# Patient Record
Sex: Male | Born: 1965 | Race: White | Hispanic: No | Marital: Single | Smoking: Former smoker
Health system: Southern US, Community
[De-identification: ages and names within clinical notes are randomized; demographics above are authoritative.]

## PROBLEM LIST (undated history)

## (undated) DIAGNOSIS — N182 Chronic kidney disease, stage 2 (mild): Secondary | ICD-10-CM

## (undated) DIAGNOSIS — E785 Hyperlipidemia, unspecified: Secondary | ICD-10-CM

## (undated) DIAGNOSIS — E119 Type 2 diabetes mellitus without complications: Secondary | ICD-10-CM

## (undated) DIAGNOSIS — I2510 Atherosclerotic heart disease of native coronary artery without angina pectoris: Secondary | ICD-10-CM

## (undated) DIAGNOSIS — I219 Acute myocardial infarction, unspecified: Secondary | ICD-10-CM

## (undated) DIAGNOSIS — L409 Psoriasis, unspecified: Secondary | ICD-10-CM

## (undated) DIAGNOSIS — F32A Depression, unspecified: Secondary | ICD-10-CM

## (undated) HISTORY — DX: Atherosclerotic heart disease of native coronary artery without angina pectoris: I25.10

## (undated) HISTORY — DX: Hyperlipidemia, unspecified: E78.5

## (undated) HISTORY — DX: Chronic kidney disease, stage 2 (mild): N18.2

## (undated) HISTORY — DX: Acute myocardial infarction, unspecified: I21.9

## (undated) HISTORY — DX: Psoriasis, unspecified: L40.9

## (undated) HISTORY — DX: Type 2 diabetes mellitus without complications: E11.9

## (undated) HISTORY — DX: Depression, unspecified: F32.A

---

## 2000-05-14 ENCOUNTER — Encounter: Payer: Self-pay | Admitting: Internal Medicine

## 2000-05-14 ENCOUNTER — Ambulatory Visit (HOSPITAL_COMMUNITY): Admission: RE | Admit: 2000-05-14 | Discharge: 2000-05-14 | Payer: Self-pay | Admitting: Internal Medicine

## 2003-11-22 ENCOUNTER — Emergency Department (HOSPITAL_COMMUNITY): Admission: EM | Admit: 2003-11-22 | Discharge: 2003-11-22 | Payer: Self-pay | Admitting: Emergency Medicine

## 2004-05-15 ENCOUNTER — Ambulatory Visit: Payer: Self-pay | Admitting: Internal Medicine

## 2006-01-20 ENCOUNTER — Ambulatory Visit: Payer: Self-pay | Admitting: Internal Medicine

## 2010-06-30 ENCOUNTER — Inpatient Hospital Stay (HOSPITAL_COMMUNITY): Payer: BC Managed Care – PPO

## 2010-06-30 ENCOUNTER — Inpatient Hospital Stay (HOSPITAL_COMMUNITY)
Admission: EM | Admit: 2010-06-30 | Discharge: 2010-07-03 | DRG: 853 | Disposition: A | Discharge status: Home / Self Care | Payer: BC Managed Care – PPO | Attending: Cardiovascular Disease | Admitting: Cardiovascular Disease

## 2010-06-30 ENCOUNTER — Emergency Department (HOSPITAL_COMMUNITY): Payer: BC Managed Care – PPO

## 2010-06-30 DIAGNOSIS — I2582 Chronic total occlusion of coronary artery: Secondary | ICD-10-CM | POA: Diagnosis present

## 2010-06-30 DIAGNOSIS — K219 Gastro-esophageal reflux disease without esophagitis: Secondary | ICD-10-CM | POA: Diagnosis present

## 2010-06-30 DIAGNOSIS — I959 Hypotension, unspecified: Secondary | ICD-10-CM | POA: Diagnosis present

## 2010-06-30 DIAGNOSIS — I2109 ST elevation (STEMI) myocardial infarction involving other coronary artery of anterior wall: Principal | ICD-10-CM | POA: Diagnosis present

## 2010-06-30 DIAGNOSIS — I2510 Atherosclerotic heart disease of native coronary artery without angina pectoris: Secondary | ICD-10-CM | POA: Diagnosis present

## 2010-06-30 DIAGNOSIS — Z87891 Personal history of nicotine dependence: Secondary | ICD-10-CM

## 2010-06-30 DIAGNOSIS — I10 Essential (primary) hypertension: Secondary | ICD-10-CM | POA: Diagnosis present

## 2010-06-30 DIAGNOSIS — I219 Acute myocardial infarction, unspecified: Secondary | ICD-10-CM

## 2010-06-30 DIAGNOSIS — E119 Type 2 diabetes mellitus without complications: Secondary | ICD-10-CM | POA: Diagnosis present

## 2010-06-30 HISTORY — PX: CORONARY ANGIOPLASTY WITH STENT PLACEMENT: SHX49

## 2010-06-30 HISTORY — PX: CARDIAC CATHETERIZATION: SHX172

## 2010-06-30 HISTORY — DX: Acute myocardial infarction, unspecified: I21.9

## 2010-06-30 LAB — COMPREHENSIVE METABOLIC PANEL
ALT: 39 U/L (ref 0–53)
AST: 26 U/L (ref 0–37)
Albumin: 3.8 g/dL (ref 3.5–5.2)
Alkaline Phosphatase: 98 U/L (ref 39–117)
BUN: 11 mg/dL (ref 6–23)
CO2: 27 mEq/L (ref 19–32)
Calcium: 9.1 mg/dL (ref 8.4–10.5)
Chloride: 100 mEq/L (ref 96–112)
Creatinine, Ser: 1.42 mg/dL (ref 0.4–1.5)
GFR calc Af Amer: >60 mL/min (ref >60)
GFR calc non Af Amer: 54 mL/min — ABNORMAL LOW (ref >60)
Glucose, Bld: 298 mg/dL — ABNORMAL HIGH (ref 70–99)
Potassium: 3.6 mEq/L (ref 3.5–5.1)
Sodium: 135 mEq/L (ref 135–145)
Total Bilirubin: 0.4 mg/dL (ref 0.3–1.2)
Total Protein: 6.8 g/dL (ref 6.0–8.3)

## 2010-06-30 LAB — CBC
HCT: 40.9 % (ref 39.0–52.0)
Hemoglobin: 14.7 g/dL (ref 13.0–17.0)
MCH: 32.2 pg (ref 26.0–34.0)
MCHC: 35.9 g/dL (ref 30.0–36.0)

## 2010-06-30 LAB — HEPATIC FUNCTION PANEL
ALT: 44 U/L (ref 0–53)
Alkaline Phosphatase: 83 U/L (ref 39–117)
Bilirubin, Direct: <0.1 mg/dL (ref 0.0–0.3)

## 2010-06-30 LAB — POCT CARDIAC MARKERS
CKMB, poc: <1 ng/mL — ABNORMAL LOW (ref 1.0–8.0)
Myoglobin, poc: 54.1 ng/mL (ref 12–200)
Troponin i, poc: <0.05 ng/mL (ref 0.00–0.09)

## 2010-06-30 LAB — POCT I-STAT, CHEM 8
BUN: 14 mg/dL (ref 6–23)
Calcium, Ion: 1.14 mmol/L (ref 1.12–1.32)
Chloride: 100 mEq/L (ref 96–112)
Creatinine, Ser: 1.4 mg/dL (ref 0.4–1.5)
Glucose, Bld: 306 mg/dL — ABNORMAL HIGH (ref 70–99)
HCT: 44 % (ref 39.0–52.0)
Hemoglobin: 15 g/dL (ref 13.0–17.0)
Potassium: 3.7 mEq/L (ref 3.5–5.1)
Sodium: 138 mEq/L (ref 135–145)
TCO2: 26 mmol/L (ref 0–100)

## 2010-06-30 LAB — CARDIAC PANEL(CRET KIN+CKTOT+MB+TROPI): Relative Index: 12.7 — ABNORMAL HIGH (ref 0.0–2.5)

## 2010-06-30 LAB — GLUCOSE, CAPILLARY

## 2010-07-01 LAB — CARDIAC PANEL(CRET KIN+CKTOT+MB+TROPI): Relative Index: 11.5 — ABNORMAL HIGH (ref 0.0–2.5)

## 2010-07-01 LAB — HEMOGLOBIN A1C
Hgb A1c MFr Bld: 8 % — ABNORMAL HIGH (ref <5.7)
Mean Plasma Glucose: 183 mg/dL — ABNORMAL HIGH (ref <117)

## 2010-07-01 LAB — BASIC METABOLIC PANEL
CO2: 29 mEq/L (ref 19–32)
Calcium: 9 mg/dL (ref 8.4–10.5)
GFR calc Af Amer: >60 mL/min (ref >60)
GFR calc non Af Amer: >60 mL/min (ref >60)
Sodium: 142 mEq/L (ref 135–145)

## 2010-07-01 LAB — GLUCOSE, CAPILLARY
Glucose-Capillary: 210 mg/dL — ABNORMAL HIGH (ref 70–99)
Glucose-Capillary: 142 mg/dL — ABNORMAL HIGH (ref 70–99)
Glucose-Capillary: 203 mg/dL — ABNORMAL HIGH (ref 70–99)

## 2010-07-01 LAB — MAGNESIUM: Magnesium: 1.9 mg/dL (ref 1.5–2.5)

## 2010-07-01 LAB — CBC
HCT: 37 % — ABNORMAL LOW (ref 39.0–52.0)
RDW: 12.7 % (ref 11.5–15.5)
WBC: 12.3 10*3/uL — ABNORMAL HIGH (ref 4.0–10.5)

## 2010-07-01 LAB — LIPID PANEL
Cholesterol: 192 mg/dL (ref 0–200)
LDL Cholesterol: 123 mg/dL — ABNORMAL HIGH (ref 0–99)
Triglycerides: 194 mg/dL — ABNORMAL HIGH (ref <150)
VLDL: 39 mg/dL (ref 0–40)

## 2010-07-01 LAB — HEPARIN LEVEL (UNFRACTIONATED)
Heparin Unfractionated: 0.11 IU/mL — ABNORMAL LOW (ref 0.30–0.70)
Heparin Unfractionated: 0.59 IU/mL (ref 0.30–0.70)

## 2010-07-01 LAB — POCT ACTIVATED CLOTTING TIME: Activated Clotting Time: 493 seconds

## 2010-07-01 NOTE — Procedures (Signed)
NAMEVASHON, Wilcox NO.:  [id number]  MEDICAL RECORD NO.:  [id number]           PATIENT TYPE:  E  LOCATION:  [facility]                         FACILITY:  [facility]  PHYSICIAN:  Nicki Guadalajara, M.D.     DATE OF BIRTH:  1965-10-02  DATE OF PROCEDURE:  06/30/2010 DATE OF DISCHARGE:                           CARDIAC CATHETERIZATION   PROCEDURE:  Emergent cardiac catheterization.  INDICATIONS:  [name] is a 45 year old gentleman who has approximately greater than 20-year history of tobacco use and approximately 7-year history of hypertension as well as GERD.  He had recently quit tobacco use approximately 2 months ago.  Today, at approximately 10:00 a.m., the patient developed new onset chest pressure.  He ultimately presented to [facility] emergency room where his ECG at 11:23 suggested early J-point elevation in leads II, III, and F.  At the time, I was in an emergent cardiac catheterization with another patient having an MI and was just completing that acute intervention.  This patient was treated with heparin in the emergency room.  As soon as I completed the previous patient, this patient was then brought up to a different room and immediately scrubbed and performed emergent catheterization for possible acute coronary syndrome/early inferior myocardial infarction.  PROCEDURE IN DETAIL:  Upon arrival to the catheterization laboratory, the patient still was having some residual chest discomfort, approximately 6/10.  He had received nitroglycerin in the emergency room which did drop his blood pressure to approximately 80.  At this time, his fluids were wide open.  Right femoral artery was punctured anteriorly and a 6-French sheath was inserted.  Diagnostic catheterization was done utilizing 6-French Judkins for the left and right coronary catheters.  The patient did have several areas of abnormality in the LAD system which led to making certain this was  not causing some of the distal disease.  With the demonstration of total occlusion of his right coronary artery, his diagnostic catheter was immediately exchanged for a 6-French right guide.  The patient received Angiomax.  He also received 60 mg of Effient antiplatelet therapy.  His blood pressure still remained in the 80 systolic range despite wide open normal saline.  A Prowater wire was advanced down the right coronary artery and was able to cross the total occlusion distally at the bifurcation of the continuation in the branched PDA and PDA.  A 2.5 x 12 mm TREK balloon was then inserted and multiple dilatations were made up to 10 atmospheres.  A 2.75 x 24 mm Promus Element DES stent was then inserted in the distal RCA to cover some additional lesion in the proximal PDA segment.  This was dilated x2 up to 13 atmospheres.  A 3.0 x 20 mm noncompliant TREK was used for poststent dilatation with stent taper from approximately 3.0 proximally to 2.9 distally.  Scout angiography confirmed an excellent angiographic result.  There was brisk TIMI 3 flow.  With reperfusion, the patient's blood pressure did decrease slightly.  Now additionally and consequently dopamine had been added and this was titrated up to 6 mcg/kg.  The right catheter and wires were then removed and a  6-French pigtail catheter was then inserted into the LV.  Biplane cine left ventriculography was performed. With the patient's hypertensive history and his multivessel CAD, distal aortography was also performed.  He left the catheterization laboratory with stable hemodynamics with continued fluid and on dopamine and will be transported to the Coronary Care Unit.  HEMODYNAMIC DATA:  Central aortic pressure was 85/64.  Left ventricular pressure 85/15.  ANGIOGRAPHIC DATA:  Left main coronary artery was angiographically normal and bifurcated into the LAD and left circumflex system.  There was proximal narrowing of 60% in the  LAD before the first diagonal vessel.  There was mid-LAD stenoses of approximately 60%.  The very distal and apical portion of the LAD was small caliber and had segmental narrowing of 80% in the small caliber vessel reaching the apex.  The circumflex vessel gave rise to one major marginal vessel.  There was 50% narrowing in the AV groove circumflex after the marginal takeoff. There was 50% narrowing in the distal aspect of this marginal branch.  The right coronary artery was totally occluded just beyond the acute margin at the region of the bifurcation of the PDA and continuation branch of the right coronary artery with initial TIMI-0 flow.  A subsequent injection did reveal a trickle of flow beyond this.  Following successful percutaneous coronary intervention to the distal right coronary artery with PTCA, ultimate stenting with a 2.75 x 24-mm DES Promus Element stent postdilated approximately at 3.0.  The 100% occlusion was reduced to 0%.  There was brisk TIMI-3 flow.  There was no change in the previous 60% mid RCA narrowing.  The biplane cine left ventriculography revealed preserved global contractility.  Ejection fraction was at least 55-60%.  On the RAO projection, there was a small subtle region of very mild focal hypocontractility.  On the LAO projection, contractility appeared normal.  Distal aortography revealed widely patent renal arteries without significant stenosis.  IMPRESSION: 1. Acute coronary syndrome/early ST-segment elevation myocardial     infarction secondary to right coronary artery occlusion. 2. Multivessel coronary artery disease with 60% proximal left anterior     descending stenosis, 60% mid stenosis, diffuse distal and apical     left anterior descending stenoses of 80%; 50% atrioventricular     groove circumflex stenosis with 50% distal circumflex marginal     stenosis and total occlusion of proximal right coronary artery. 3. Successful percutaneous  coronary intervention of the right coronary     artery with 100% occlusion being reduced to 0% and with insertion     of a 2.75 x 24 mm Promus Element drug eluting stent postdilated     3.0. 4. Bivalidurin/Effient 60 mg for anticoagulation/antiplatelet therapy. 5. The patient was hypotensive requiring aggressive fluids as well as     initiation of dopamine. 6. Once the patient arrived in the cardiac catheterization laboratory,     there was a 27-minute time to balloon dilatation from cath lab     arrival.          ______________________________ Nicki Guadalajara, M.D.     TK/MEDQ  D:  06/30/2010  T:  07/01/2010  Job:  [id number]  Electronically Signed by Nicki Guadalajara M.D. on 07/01/2010 04:01:41 PM

## 2010-07-02 LAB — GLUCOSE, CAPILLARY
Glucose-Capillary: 139 mg/dL — ABNORMAL HIGH (ref 70–99)
Glucose-Capillary: 170 mg/dL — ABNORMAL HIGH (ref 70–99)
Glucose-Capillary: 155 mg/dL — ABNORMAL HIGH (ref 70–99)

## 2010-07-02 LAB — CBC
MCH: 31.3 pg (ref 26.0–34.0)
MCHC: 34.4 g/dL (ref 30.0–36.0)
RDW: 12.7 % (ref 11.5–15.5)

## 2010-07-02 LAB — BASIC METABOLIC PANEL
Calcium: 8.9 mg/dL (ref 8.4–10.5)
Creatinine, Ser: 1.24 mg/dL (ref 0.4–1.5)
GFR calc Af Amer: >60 mL/min (ref >60)
GFR calc non Af Amer: >60 mL/min (ref >60)
Sodium: 141 mEq/L (ref 135–145)

## 2010-07-03 LAB — GLUCOSE, CAPILLARY: Glucose-Capillary: 149 mg/dL — ABNORMAL HIGH (ref 70–99)

## 2010-07-05 NOTE — Discharge Summary (Signed)
NAMEBENNETT, Dennis Wilcox             ACCOUNT NO.:  [id number]  MEDICAL RECORD NO.:  [id number]           PATIENT TYPE:  I  LOCATION:  2024                         FACILITY:  [facility]  PHYSICIAN:  [place] Finbar Nippert, MD         DATE OF BIRTH:  November 08, 1965  DATE OF ADMISSION:  06/30/2010 DATE OF DISCHARGE:  07/03/2010                              DISCHARGE SUMMARY   DISCHARGE DIAGNOSES: 1. ST-elevation myocardial infarction anterior wall status post stent     to the distal right coronary artery.  The left anterior descending     with 60% proximal stenosis, 60% mid stenosis, 80% diffuse distal     stenosis.  Circumflex showed distal marginal and atrioventricular     groove with 50% stenosis.  Ejection fraction of 55% to 60%. 2. Hypotension. 3. Gastroesophageal reflux. 4. Tobacco use, the patient states he quit 2 months ago. 5. Day venous mellitus type 2, poorly diagnosed, hemoglobin A1c is     8.0.  HOSPITAL COURSE:  Dennis Wilcox is a 45 year old Caucasian male with a history of hypertension and gastroesophageal reflux disease.  He developed upper anterior chest pain with radiation to his neck and jaw. He initially thought it might be indigestion.  He became hot, diaphoretic, had short of breath, nausea, and diarrhea.  In the ED here at [facility], he was given aspirin and nitroglycerin with some relief. He also had decreased blood pressure and was given IV fluids.  EKG showed J-point elevation in lead III.  The patient was subsequently taken to the cath lab urgently.  His initial point-of-care markers were negative; however, subsequent troponin was 27.34 which was the peak and CK-MB of 141.2 which was the peak of that one as well.  His BNP was less than 30.  In the cath lab, he was found to have multivessel coronary artery disease with a 60% proximal left anterior descending stenosis, 60% mid stenosis, diffuse distal and apical left anterior descending stenoses 80%, also a 50% atrioventricular  groove circumflex stenosis, 50% distal circumflex marginal stenosis, and total occlusion of the proximal right coronary artery.  Right coronary artery was stented with a Promus drug-eluting stent.  The patient was hypotensive, required aggressive fluid as well as initiation of dopamine.  It was 27 minutes timed balloon dilation from the cath lab.  As of July 01, 2010, the patient was feeling better.  O2 sats dropped to 87% on room air.  The patient's blood pressure improved and was off the dopamine.  His Lopressor was increased to 12.5 q.8 h., also started Niaspan.  The patient was started on cardiac rehab, was continued on heparin.  The patient is also newly diagnosed with type 2 diabetes.  His hemoglobin A1c was 8.0.  Nutrition consult was requested.  The patient will go home on metformin 500 mg b.i.d. and has been recommended to follow up with his primary care doctor.  At this time, he is chest pain free, doing well, seen by Dr. Rennis Golden who feels he is stable for discharged home.  DISCHARGE LABORATORY DATA:  WBCs 8.9, hemoglobin 12.8, hematocrit 37.2, platelets 330,000.  Sodium  141, potassium 4.1, chloride 108, carbon dioxide 28, glucose 154, BUN 10, creatinine 1.24, calcium 8.9. Hemoglobin A1c 8.0, mean plasma glucose 183.  Total cholesterol 192, triglycerides 194, HDL 30, LDL 123, VLDL 39, and total cholesterol to HDL ratio of 6.4.  Thyroid stimulating hormone was 1.233, and he was MRSA negative.  STUDIES/PROCEDURES: 1. Chest x-ray on June 30, 2010, showed no acute cardiopulmonary     process.  Lungs were clear without focal infiltrate, edema,     pneumothorax, or effusion.  Pericardial silhouette.  Upper limits     of normal for size. 2. Cardiac catheterization on June 30, 2010, impression acute     coronary syndrome, early ST-segment elevation myocardial infarction     secondary to right coronary occlusion.  Multivessel coronary artery     disease of 60%, proximal left  anterior descending stenosis, 60% mid     stenosis, diffuse distal typical left anterior descending stenoses     of 80%.  A 50% atrioventricular groove circumflex stenosis and 50%     distal circumflex marginal stenosis, total occlusion of the     proximal right coronary artery.  A Promus drug-eluting stent     successfully placed in the right coronary artery with a 100%     occlusion being reduced to 0%.  Ejection fraction was estimated at     55% to 60%.  DISCHARGE MEDICATIONS: 1. Acetaminophen 325 mg 2 tablets by mouth every fours as needed for     pain. 2. Aspirin 325 mg enteric-coated 1 tablet by mouth daily. 3. Imdur 30 mg one-half tablet by mouth daily. 4. Lisinopril 2.5 mg one tablet by mouth daily. 5. Metoprolol tartrate 25 mg one-half tablet by mouth three times a     day. 6. Niacin 500 mg tablets extended release 1 tablet by mouth daily at     bedtime. 7. Nitroglycerin sublingual 0.4 mg tablets 1 tablet under the tongue     every 5 minutes up to three doses for chest pain. 8. Effient 10 mg 1 tablet by mouth daily. 9. Rosuvastatin 10 mg 1 tablet by mouth daily. 10.Omeprazole 20 mg 1 capsule by mouth at noon. 11.Venlafaxine XR 75 mg 2 capsules by mouth every morning. 12.Metformin 500 mg 1 tablet by mouth twice daily.  DISPOSITION:  Dennis Wilcox will be discharged home in stable condition. He is to return to work no earlier than July 22, 2010.  He is to increase his activity slowly.  May shower and bathe.  No lifting or driving for 2 days.  He was recommended he eat heart-healthy, carb- modified diet.  If the catheter site becomes red, painful, swollen, or discharges fluid or pus, he is to call our office immediately.  He will return to Dr. Landry Dyke office on Wednesday, July 10, 2010, at 8:30 a.m. for followup appointment.    ______________________________ Wilburt Finlay, PA   ______________________________ [place] Ia Leeb, MD    BH/MEDQ  D:  07/03/2010  T:  07/04/2010   Job:  [id number]  cc:   Edythe Lynn, NP  Electronically Signed by Wilburt Finlay PA on 07/04/2010 03:17:19 PM Electronically Signed by Kirtland Bouchard. Acquanetta Cabanilla M.D. on 07/05/2010 04:44:59 PM

## 2010-08-06 DIAGNOSIS — E785 Hyperlipidemia, unspecified: Secondary | ICD-10-CM

## 2010-08-06 HISTORY — DX: Hyperlipidemia, unspecified: E78.5

## 2011-08-21 HISTORY — PX: OTHER SURGICAL HISTORY: SHX169

## 2011-12-13 IMAGING — CR DG CHEST 1V PORT
1 series · 1 of 1 positions shown · non-contrast
Comparison: None.

CLINICAL DATA: Chest pain

PORTABLE CHEST - 1 VIEW

[view not recorded]
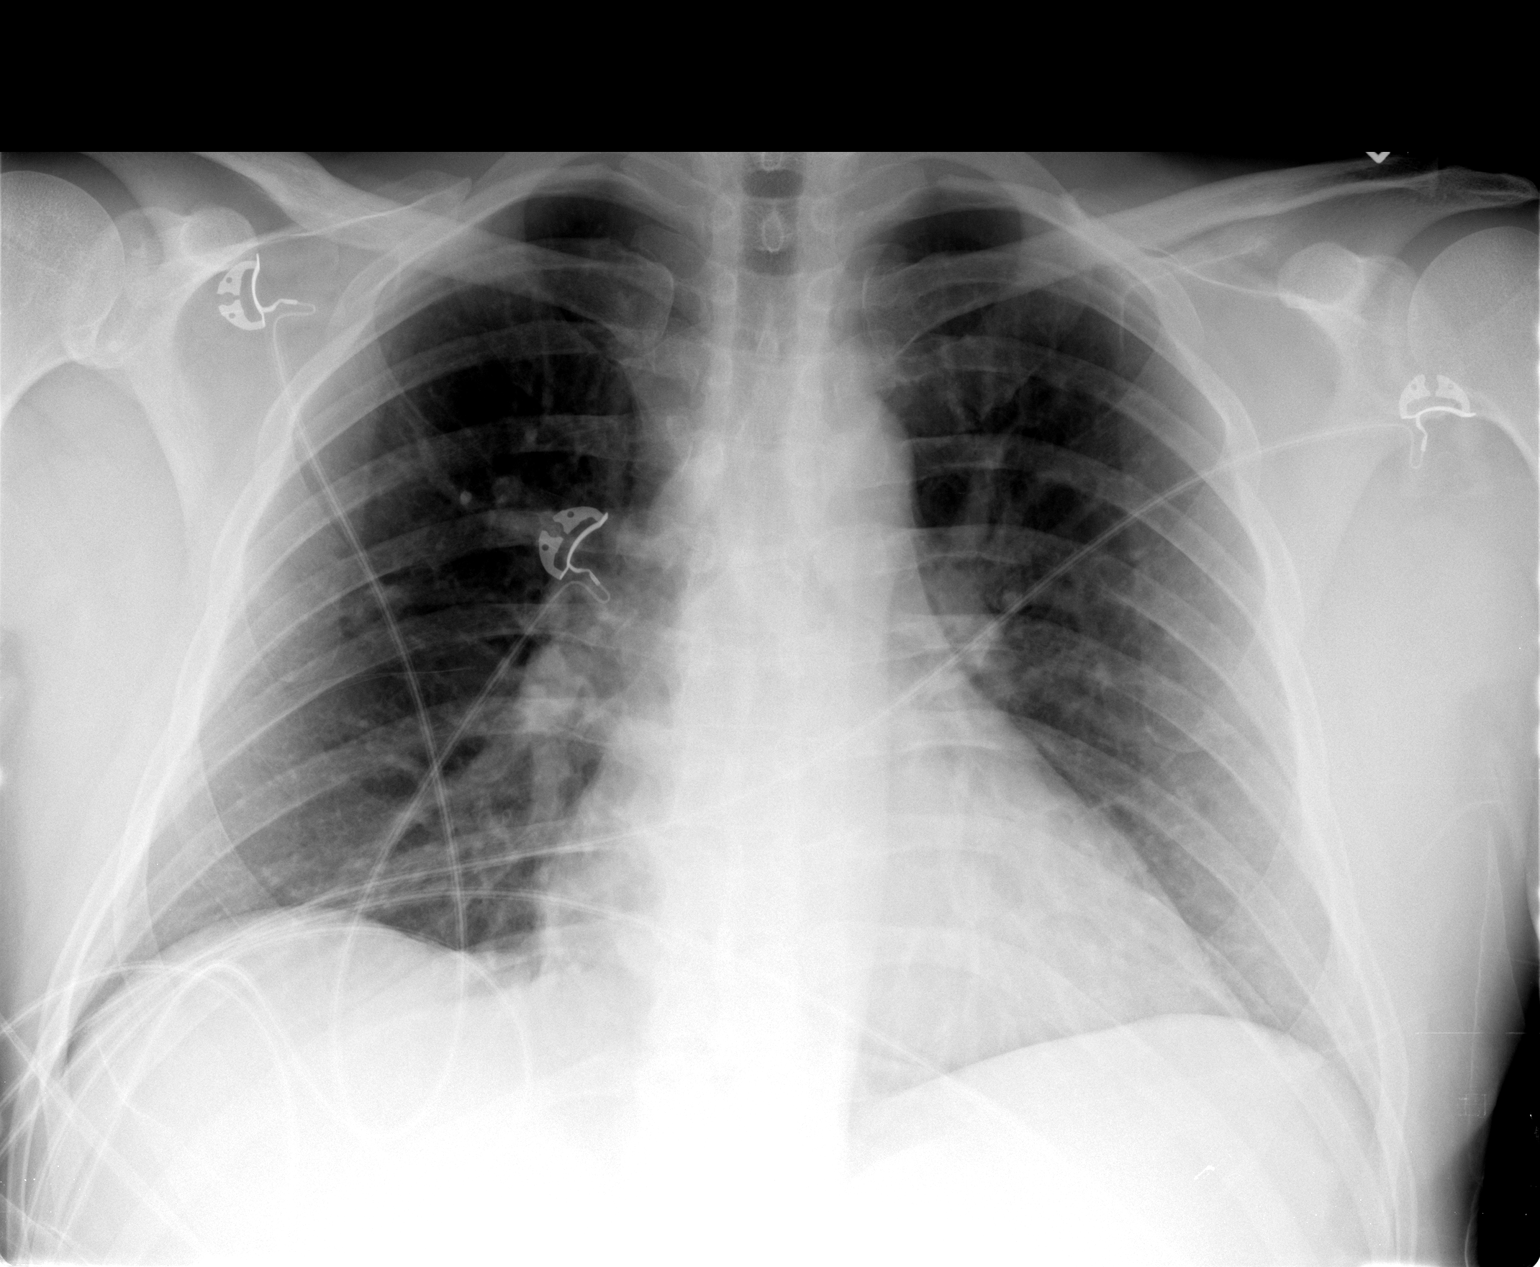

[1 of 1 positions shown; findings below may reference images not displayed]

FINDINGS: 5891 hours. The lungs are clear without focal infiltrate,
edema, pneumothorax or pleural effusion. Cardiopericardial
silhouette is at upper limits of normal for size. Imaged bony
structures of the thorax are intact. Telemetry leads overlie the
chest.
IMPRESSION: No acute cardiopulmonary process.

## 2012-07-18 ENCOUNTER — Encounter: Payer: Self-pay | Admitting: *Deleted

## 2012-07-20 ENCOUNTER — Encounter: Payer: Self-pay | Admitting: Cardiovascular Disease

## 2012-08-31 ENCOUNTER — Ambulatory Visit: Payer: BC Managed Care – PPO | Admitting: Cardiovascular Disease

## 2012-11-19 ENCOUNTER — Other Ambulatory Visit: Payer: Self-pay | Admitting: Cardiovascular Disease

## 2012-11-19 NOTE — Telephone Encounter (Signed)
Rx was sent to pharmacy electronically. 

## 2012-11-23 ENCOUNTER — Telehealth: Payer: Self-pay | Admitting: Cardiovascular Disease

## 2012-11-23 DIAGNOSIS — Z79899 Other long term (current) drug therapy: Secondary | ICD-10-CM

## 2012-11-23 DIAGNOSIS — R5381 Other malaise: Secondary | ICD-10-CM

## 2012-11-23 DIAGNOSIS — E782 Mixed hyperlipidemia: Secondary | ICD-10-CM

## 2012-11-23 NOTE — Telephone Encounter (Signed)
He wants to change pharmacy-please call his Crestor 20 mg,Effient 10 mg and Metoprolol 25 mg>please call these new prescriptions to [facility] -[phone].

## 2012-11-23 NOTE — Telephone Encounter (Signed)
Returned call.  Pt informed refills sent on 8.15.14 for all three meds and we will not be able to send another Rx as he has not been seen in over a year (May 2013).  Pt informed he will need to pick up Rxs there and schedule an appt before he runs out as he will physically need to be seen for more refills.  Pt verbalized understanding and agreed w/ plan.  Appt scheduled for 9.4.14 at 9am w/ Dr. Tresa Endo.  Labs ordered in case pt needs them.  Stated he did have labs by PCP a couple of months ago.  Informed if he doesn't need labs, he can discard lab order that is being mailed.  Pt verbalized understanding and agreed w/ plan.

## 2012-12-09 ENCOUNTER — Ambulatory Visit (INDEPENDENT_AMBULATORY_CARE_PROVIDER_SITE_OTHER): Payer: PRIVATE HEALTH INSURANCE | Admitting: Cardiovascular Disease

## 2012-12-09 ENCOUNTER — Encounter: Payer: Self-pay | Admitting: Cardiovascular Disease

## 2012-12-09 VITALS — BP 84/60 | HR 57 | Ht 70.0 in | Wt 201.7 lb

## 2012-12-09 DIAGNOSIS — I2510 Atherosclerotic heart disease of native coronary artery without angina pectoris: Secondary | ICD-10-CM

## 2012-12-09 DIAGNOSIS — E119 Type 2 diabetes mellitus without complications: Secondary | ICD-10-CM

## 2012-12-09 DIAGNOSIS — I10 Essential (primary) hypertension: Secondary | ICD-10-CM

## 2012-12-09 DIAGNOSIS — E782 Mixed hyperlipidemia: Secondary | ICD-10-CM

## 2012-12-09 DIAGNOSIS — E785 Hyperlipidemia, unspecified: Secondary | ICD-10-CM

## 2012-12-09 MED ORDER — NIACIN ER (ANTIHYPERLIPIDEMIC) 500 MG PO TBCR: 500.0000 mg | EXTENDED_RELEASE_TABLET | Freq: Every day | ORAL | Status: DC

## 2012-12-09 NOTE — Patient Instructions (Addendum)
Your physician has recommended you make the following change in your medication: Restart Niaspan. This has already been sent to your pharmacy. Decrease your lisinopril down to 2.5 mg ( 1/2 tablet) daily.  Start over the counter fish oil as directed by Dr. Tresa Endo.  Your physician recommends that you schedule a follow-up appointment in: 4 MONTHS.  Your physician recommends that you return for lab work in: 8 WEEKS fasting.

## 2012-12-21 ENCOUNTER — Other Ambulatory Visit: Payer: Self-pay | Admitting: Cardiovascular Disease

## 2012-12-21 NOTE — Telephone Encounter (Signed)
Rx was sent to pharmacy electronically. 

## 2012-12-29 ENCOUNTER — Encounter: Payer: Self-pay | Admitting: Cardiovascular Disease

## 2012-12-29 DIAGNOSIS — I10 Essential (primary) hypertension: Secondary | ICD-10-CM | POA: Insufficient documentation

## 2012-12-29 DIAGNOSIS — I2510 Atherosclerotic heart disease of native coronary artery without angina pectoris: Secondary | ICD-10-CM | POA: Insufficient documentation

## 2012-12-29 DIAGNOSIS — E119 Type 2 diabetes mellitus without complications: Secondary | ICD-10-CM | POA: Insufficient documentation

## 2012-12-29 DIAGNOSIS — E782 Mixed hyperlipidemia: Secondary | ICD-10-CM | POA: Insufficient documentation

## 2012-12-29 NOTE — Progress Notes (Signed)
Patient ID: Dennis Wilcox, male   DOB: 12-06-1965, 47 y.o.   MRN: [id number]     HPI: OMIR COOPRIDER, is a 47 y.o. male who presents for a one-year cardiology evaluation.  I last saw him in May May 2013.  Mr. Luckow suffered an inferior wall myocardial infarction on 06/30/2010 and underwent successful PCI with stenting of a to a totally occluded right coronary artery. He had 3 vessel CAD with segmental LAD disease, 60% proximal, 60% mid and 80% distally. There was a 50% AV groove stenosis and 50% distal circumflex stenosis. The right coronary was totally occluded at the acute margin. He was successfully intervened upon. Subsequently I did perform [facility] heart lab assessment which showed marked abnormalities including significant increased risk kinetics. He also is a history of hypertension, hyperlipidemia, increased Lp-(a) and has been on combination therapy. He also had vitamin D insufficiency.  Over the past year, he denies any recurrent episodes of chest tightness. His last nuclear perfusion study was done in May 2013 showed normal perfusion without scar or ischemia. At times he may experience some mild lightheadedness. He denies syncope. Denies chest pain.  Past Medical History  Diagnosis Date  . Myocardial infarction 06/30/2010    infer wall MI, cath 3 vessel CAD  . Hyperlipemia 08/06/2010    [facility] heartlab completed ;repeated Feb 2013    Past Surgical History  Procedure Laterality Date  . Cardiac catheterization  06/30/2010    Emergent  . R/s myocardial perfusion  08/21/2011    EF72%   . Coronary angioplasty with stent placement  06/30/2010    DES of RCA;LAD 60% prox,60%mid, 80% distal; 50% AVgroove stenosis, 50%distal circ stenosis    Allergies  Allergen Reactions  . Novocain [Procaine Hcl]     Pt thinks he had a reaction to this med    Current Outpatient Prescriptions  Medication Sig Dispense Refill  . aspirin EC 81 MG tablet Take 81 mg by mouth daily.      .  isosorbide mononitrate (IMDUR) 30 MG 24 hr tablet Take 30 mg by mouth daily.       Marland Kitchen lisinopril (PRINIVIL,ZESTRIL) 5 MG tablet Take 5 mg by mouth daily.      . metFORMIN (GLUCOPHAGE) 500 MG tablet Take 500 mg by mouth 2 (two) times daily with a meal.      . nitroGLYCERIN (NITROSTAT) 0.4 MG SL tablet Place 0.4 mg under the tongue every 5 (five) minutes as needed for chest pain.      Marland Kitchen omeprazole (PRILOSEC) 20 MG capsule Take 20 mg by mouth daily.      Marland Kitchen venlafaxine XR (EFFEXOR-XR) 75 MG 24 hr capsule Take 75 mg by mouth daily.      . CRESTOR 20 MG tablet TAKE ONE TABLET BY MOUTH AT BEDTIME  30 tablet  11  . EFFIENT 10 MG TABS tablet TAKE ONE TABLET BY MOUTH ONCE DAILY  30 tablet  11  . metoprolol tartrate (LOPRESSOR) 25 MG tablet TAKE ONE TABLET BY MOUTH TWICE DAILY  60 tablet  11  . niacin (NIASPAN) 500 MG CR tablet Take 1 tablet (500 mg total) by mouth at bedtime.  30 tablet  11   No current facility-administered medications for this visit.    History   Social History  . Marital Status: Single    Spouse Name: N/A    Number of Children: N/A  . Years of Education: N/A   Occupational History  . Not on file.  Social History Main Topics  . Smoking status: Former Smoker    Types: Cigarettes    Quit date: 04/07/2010  . Smokeless tobacco: Never Used  . Alcohol Use: No  . Drug Use: Not on file  . Sexual Activity: Not on file   Other Topics Concern  . Not on file   Social History Narrative  . No narrative on file    History reviewed. No pertinent family history.  ROS is negative for fevers, chills or night sweats. He denies visual symptoms. He denies presyncope or syncope. He denies wheezing. There's no recurrent chest pressure. He denies tachycardia palpitations. He denies change in bowel or bladder habits. He denies edema. There is no claudication.  Other system review is negative.  PE BP 84/60  Pulse 57  Ht 5\' 10"  (1.778 m)  Wt 201 lb 11.2 oz (91.491 kg)  BMI 28.94 kg/m2    Repeat blood pressure 90/60 supine and 92/60 standing General: Alert, oriented, no distress.  Skin: normal turgor, no rashes HEENT: Normocephalic, atraumatic. Pupils round and reactive; sclera anicteric;no lid lag.  Nose without nasal septal hypertrophy Mouth/Parynx benign; Mallinpatti scale 2 Neck: No JVD, no carotid briuts Lungs: clear to ausculatation and percussion; no wheezing or rales Heart: RRR, s1 s2 normal; no S3 or S4 gallop or Abdomen: soft, nontender; no hepatosplenomehaly, BS+; abdominal aorta nontender and not dilated by palpation. Pulses 2+ Extremities: no clubbing cyanosis or edema, Homan's sign negative  Neurologic: grossly nonfocal  ECG: Sinus rhythm 57 beats per minute. Early transition.  LABS:  BMET    Component Value Date/Time   NA 141 07/02/2010 0535   K 4.1 07/02/2010 0535   CL 108 07/02/2010 0535   CO2 28 07/02/2010 0535   GLUCOSE 154* 07/02/2010 0535   BUN 10 07/02/2010 0535   CREATININE 1.24 07/02/2010 0535   CALCIUM 8.9 07/02/2010 0535   GFRNONAA >60 07/02/2010 0535   GFRAA  Value: >60        The eGFR has been calculated using the MDRD equation. This calculation has not been validated in all clinical situations. eGFR's persistently <60 mL/min signify possible Chronic Kidney Disease. 07/02/2010 0535     Hepatic Function Panel     Component Value Date/Time   PROT 5.8* 06/30/2010 2036   ALBUMIN 3.1* 06/30/2010 2036   AST 99* 06/30/2010 2036   ALT 44 06/30/2010 2036   ALKPHOS 83 06/30/2010 2036   BILITOT 0.5 06/30/2010 2036   BILIDIR <0.1 06/30/2010 2036   IBILI NOT CALCULATED 06/30/2010 2036     CBC    Component Value Date/Time   WBC 8.9 07/02/2010 0535   RBC 4.09* 07/02/2010 0535   HGB 12.8* 07/02/2010 0535   HCT 37.2* 07/02/2010 0535   PLT 330 07/02/2010 0535   MCV 91.0 07/02/2010 0535   MCH 31.3 07/02/2010 0535   MCHC 34.4 07/02/2010 0535   RDW 12.7 07/02/2010 0535     BNP    Component Value Date/Time   PROBNP <30.0 06/30/2010 2036    Lipid Panel      Component Value Date/Time   CHOL  Value: 192        ATP III CLASSIFICATION:  <200     mg/dL   Desirable  161-096  mg/dL   Borderline High  >=045    mg/dL   High        07/14/8117 0325   TRIG 194* 07/01/2010 0325   HDL 30* 07/01/2010 0325   CHOLHDL 6.4 07/01/2010 0325  VLDL 39 07/01/2010 0325   LDLCALC  Value: 123        Total Cholesterol/HDL:CHD Risk Coronary Heart Disease Risk Table                     Men   Women  1/2 Average Risk   3.4   3.3  Average Risk       5.0   4.4  2 X Average Risk   9.6   7.1  3 X Average Risk  23.4   11.0        Use the calculated Patient Ratio above and the CHD Risk Table to determine the patient's CHD Risk.        ATP III CLASSIFICATION (LDL):  <100     mg/dL   Optimal  161-096  mg/dL   Near or Above                    Optimal  130-159  mg/dL   Borderline  045-409  mg/dL   High  >811     mg/dL   Very High* 12/20/7827 0325     RADIOLOGY: No results found.    ASSESSMENT AND PLAN:  Mr. Hue is now 47 years old. He suffered an inferior wall myocardial infarction on 06/30/2010 and underwent successful intervention to totally occluded right coronary artery with significant myocardial salvage. He is being aggressively treated with combination therapy. His blood pressure today is somewhat low and I repeat by me was 90/60. I am recommending he decrease his lisinopril to 2.5 mg on his current dose of 5 mg daily. He does have diabetes mellitus. I recommended he restart Niaspan 500 mg with his history of significant increased LPa. I also recommended over-the-counter fish oil. In 8 weeks repeat laboratory obtained including a comprehensive metabolic panel, and an NMR lipoprofile, as well as a hemoglobin A1c. I will see him in 4 months for cardiology reevaluation.    Lennette Bihari, MD, [facility]  12/29/2012 9:24 PM

## 2013-05-09 ENCOUNTER — Ambulatory Visit: Payer: PRIVATE HEALTH INSURANCE | Admitting: Cardiovascular Disease

## 2013-09-30 ENCOUNTER — Other Ambulatory Visit: Payer: Self-pay | Admitting: *Deleted

## 2013-09-30 MED ORDER — ROSUVASTATIN CALCIUM 20 MG PO TABS: 20.0000 mg | ORAL_TABLET | Freq: Every day | ORAL | Status: DC

## 2013-09-30 NOTE — Telephone Encounter (Signed)
Rx refill sent to patient pharmacy   

## 2014-01-26 ENCOUNTER — Other Ambulatory Visit: Payer: Self-pay | Admitting: Cardiovascular Disease

## 2014-01-26 NOTE — Telephone Encounter (Signed)
Rx was sent to pharmacy electronically. 

## 2014-03-07 ENCOUNTER — Telehealth: Payer: Self-pay | Admitting: Cardiovascular Disease

## 2014-03-07 NOTE — Telephone Encounter (Signed)
Pt need a new prescription for his Effient,pt have not been taking it for a month.Also needs a replacement for his cholesterol medicine,insurance will no longer pa for it. Please [facility] these [phone]

## 2014-03-07 NOTE — Telephone Encounter (Signed)
Left message for patient, he has not been seen in one year. He will need to make an appointment to see about restarting the brilinta and what cholesterol med would be best for him.

## 2014-04-14 ENCOUNTER — Other Ambulatory Visit: Payer: Self-pay | Admitting: Cardiovascular Disease

## 2014-04-14 NOTE — Telephone Encounter (Signed)
Rx(s) sent to pharmacy electronically.  

## 2014-04-17 ENCOUNTER — Other Ambulatory Visit: Payer: Self-pay | Admitting: Cardiovascular Disease

## 2015-04-20 ENCOUNTER — Telehealth: Payer: Self-pay | Admitting: Cardiovascular Disease

## 2015-04-20 NOTE — Telephone Encounter (Signed)
Pt is calling in wanting to come in and have a possible EKG . He says that he has been having some really bad indigestion for the past couple of days void of any other symptoms like , SOB, sweating,weakness.  Last OV 12/2012 STEMI 06/2010 [place] and wonders if it ok to go to the ED there to get worked up  [facility].  He has not been following up with a cardiologist and has been off Effient for "a good long time" Told him best to get it evaluated for the possibility of something acute going on and if there is he will get an intervention if not we can get him an appointment to follow up here

## 2015-04-20 NOTE — Telephone Encounter (Signed)
Pt is calling in wanting to come in and have a possible EKG . He says that he has been having  some really bad indigestion for the past couple of days void of any other symptoms like , SOB, sweating,weakness. Please f/u with him   Thanks

## 2015-04-30 NOTE — Telephone Encounter (Signed)
Set up for f/u ov

## 2016-09-10 ENCOUNTER — Telehealth: Payer: Self-pay | Admitting: Cardiovascular Disease

## 2016-09-10 NOTE — Telephone Encounter (Signed)
Left detailed message for Joyce[facility], Pt needs to call and make appt to discuss (see other messages) not seen since 2014.

## 2016-09-10 NOTE — Telephone Encounter (Signed)
New message     Dennis Wilcox was put on Effient and he stopped taking it a year ago, what would you recommend as a blood thinner for this pt other than a 81mg  aspirin

## 2016-09-11 NOTE — Telephone Encounter (Signed)
Spoke with pt, aware he will need an appointment. He asked to call back to schedule.

## 2018-01-19 ENCOUNTER — Encounter: Payer: Self-pay | Admitting: Cardiovascular Disease

## 2018-01-19 ENCOUNTER — Ambulatory Visit: Payer: Managed Care, Other (non HMO) | Admitting: Cardiovascular Disease

## 2018-01-19 VITALS — BP 104/76 | HR 62 | Ht 70.0 in | Wt 186.0 lb

## 2018-01-19 DIAGNOSIS — I2510 Atherosclerotic heart disease of native coronary artery without angina pectoris: Secondary | ICD-10-CM | POA: Diagnosis not present

## 2018-01-19 DIAGNOSIS — R0609 Other forms of dyspnea: Secondary | ICD-10-CM

## 2018-01-19 DIAGNOSIS — I252 Old myocardial infarction: Secondary | ICD-10-CM

## 2018-01-19 DIAGNOSIS — E782 Mixed hyperlipidemia: Secondary | ICD-10-CM | POA: Diagnosis not present

## 2018-01-19 DIAGNOSIS — Z79899 Other long term (current) drug therapy: Secondary | ICD-10-CM

## 2018-01-19 DIAGNOSIS — Z812 Family history of tobacco abuse and dependence: Secondary | ICD-10-CM

## 2018-01-19 MED ORDER — ROSUVASTATIN CALCIUM 40 MG PO TABS: 40.0000 mg | ORAL_TABLET | Freq: Every day | ORAL | 3 refills | Status: DC

## 2018-01-19 NOTE — Progress Notes (Signed)
Cardiology Office Note    Date:  01/21/2018   ID:  [name], DOB 04/02/1966, MRN [id number]  PCP:  Jonathon Jordan, MD  Cardiologist:  Shelva Majestic, MD   Cardiology evaluation referred through the courtesy of Dr. Jonathon Jordan for reestablishment of cardiology care.  History of Present Illness:  Dennis Wilcox is a 52 y.o. male who has a history of prior myocardial infarction in 2012.  He has not been evaluated by cardiology since 2014.  He was recently evaluated by Dr. Morton Stall and due to symptoms of exertional fatigue and shortness of breath he is referred for cardiology evaluation.  [name] suffered an inferior wall myocardial infarction on June 30, 2010 and underwent successful PCI with stenting to a totally occluded RCA.  He had concomitant three-vessel CAD with segmental disease in his LAD of 60% proximal 60% mid and 80% distal lesions.  He had a 50% AV groove stenosis and 50% distal circumflex stenosis.  His RCA was successfully stented which resulted in significant myocardial salvage.  Initially he had been treated with aggressive therapy and during his initial evaluations with me I also performed [facility] heart lab assessment which showed marked abnormalities.  He also had a increased LPA and had been on combination therapy with niacin in addition to rosuvastatin.  His last nuclear perfusion study was done in May 2013 which showed normal perfusion without scar or ischemia.  Over the past 6 years he has continued to do fairly well.  However, he has noticed some exertional fatigue and exertional shortness of breath.  He is active and does yard work.  He works as a Hotel manager.  He was diagnosed with diabetes mellitus at age 66.  He was recently started on for CIGA 5 mg to take in addition to his metformin.  He has been on isosorbide 30 mg, lisinopril 5 mg, metoprolol 25 mg twice a day for hypertension and anti-ischemic benefit.  He apparently has been on simvastatin 40 mg and  is no longer on rosuvastatin.  He is no longer taking niacin.  He presents to reestablish cardiology care.   Past Medical History:  Diagnosis Date  . Hyperlipemia 08/06/2010   [facility] completed ;repeated Feb 2013  . Myocardial infarction ([place]) 06/30/2010   infer wall MI, cath 3 vessel CAD    Past Surgical History:  Procedure Laterality Date  . CARDIAC CATHETERIZATION  06/30/2010   Emergent  . CORONARY ANGIOPLASTY WITH STENT PLACEMENT  06/30/2010   DES of RCA;LAD 60% prox,60%mid, 80% distal; 50% AVgroove stenosis, 50%distal circ stenosis  . R/S myocardial perfusion  08/21/2011   EF72%     Current Medications: Outpatient Medications Prior to Visit  Medication Sig Dispense Refill  . aspirin EC 81 MG tablet Take 81 mg by mouth daily.    . isosorbide mononitrate (IMDUR) 30 MG 24 hr tablet Take 30 mg by mouth daily.     Marland Kitchen lisinopril (PRINIVIL,ZESTRIL) 5 MG tablet Take 5 mg by mouth daily.    . metFORMIN (GLUCOPHAGE) 500 MG tablet Take 500 mg by mouth 2 (two) times daily with a meal.    . metoprolol tartrate (LOPRESSOR) 25 MG tablet Take 1 tablet (25 mg total) by mouth 2 (two) times daily. NEED APPOINTMENT FOR FUTURE REFILLS. 30 tablet 0  . niacin (NIASPAN) 500 MG CR tablet Take 1 tablet (500 mg total) by mouth at bedtime. 30 tablet 11  . nitroGLYCERIN (NITROSTAT) 0.4 MG SL tablet Place 0.4 mg under the tongue  every 5 (five) minutes as needed for chest pain.    Marland Kitchen omeprazole (PRILOSEC) 20 MG capsule Take 20 mg by mouth daily.    Marland Kitchen venlafaxine XR (EFFEXOR-XR) 75 MG 24 hr capsule Take 75 mg by mouth daily.    . rosuvastatin (CRESTOR) 20 MG tablet Take 1 tablet (20 mg total) by mouth daily. 30 tablet 5  . EFFIENT 10 MG TABS tablet TAKE ONE TABLET BY MOUTH ONCE DAILY 30 tablet 11   No facility-administered medications prior to visit.      Allergies:   Novocain [procaine hcl]   Social History   Socioeconomic History  . Marital status: Single    Spouse name: Not on file  . Number  of children: Not on file  . Years of education: Not on file  . Highest education level: Not on file  Occupational History  . Not on file  Social Needs  . Financial resource strain: Not on file  . Food insecurity:    Worry: Not on file    Inability: Not on file  . Transportation needs:    Medical: Not on file    Non-medical: Not on file  Tobacco Use  . Smoking status: Former Smoker    Types: Cigarettes    Last attempt to quit: 04/07/2010    Years since quitting: 7.7  . Smokeless tobacco: Never Used  Substance and Sexual Activity  . Alcohol use: No  . Drug use: Not on file  . Sexual activity: Not on file  Lifestyle  . Physical activity:    Days per week: Not on file    Minutes per session: Not on file  . Stress: Not on file  Relationships  . Social connections:    Talks on phone: Not on file    Gets together: Not on file    Attends religious service: Not on file    Active member of club or organization: Not on file    Attends meetings of clubs or organizations: Not on file    Relationship status: Not on file  Other Topics Concern  . Not on file  Social History Narrative  . Not on file    Socially he is single.  He has 1 child.  He lives with his significant other and mother.  He works in Press photographer for [employer], and altered.  He has a BS degree.  He had smoked for 23 years and then quit for 5 years.  Apparently he had then began to date a woman who smoked and again resumed smoking.  Currently he is smoking 3 to 4 cigarettes/day.  He does drink occasional beer.  He does not routinely exercise.  Family History:  The patient's family history is not on file.  His mother is living at age 66 and has dementia.  Father died at age 51 and had diabetes and heart disease.  He has a deceased sister.  He has 1 child age 35.  ROS General: Negative; No fevers, chills, or night sweats;  HEENT: Negative; No changes in vision or hearing, sinus congestion, difficulty swallowing Pulmonary: Negative;  No cough, wheezing, shortness of breath, hemoptysis Cardiovascular: See HPI GI: Negative; No nausea, vomiting, diarrhea, or abdominal pain GU: Negative; No dysuria, hematuria, or difficulty voiding Musculoskeletal: Negative; no myalgias, joint pain, or weakness Hematologic/Oncology: Negative; no easy bruising, bleeding Endocrine: Negative; no heat/cold intolerance; no diabetes Neuro: Negative; no changes in balance, headaches Skin: Negative; No rashes or skin lesions Psychiatric: Negative; No behavioral problems, depression Sleep:  Negative; No snoring, daytime sleepiness, hypersomnolence, bruxism, restless legs, hypnogognic hallucinations, no cataplexy Other comprehensive 14 point system review is negative.   PHYSICAL EXAM:   VS:  BP 104/76 (BP Location: Right Arm, Patient Position: Sitting, Cuff Size: Normal)   Pulse 62   Ht 5' 10"  (1.778 m)   Wt 186 lb (84.4 kg)   BMI 26.69 kg/m     Repeat blood pressure by me 110/70 supine and 104/68 standing Wt Readings from Last 3 Encounters:  01/19/18 186 lb (84.4 kg)  12/09/12 201 lb 11.2 oz (91.5 kg)    General: Alert, oriented, no distress.  Skin: normal turgor, no rashes, warm and dry HEENT: Normocephalic, atraumatic. Pupils equal round and reactive to light; sclera anicteric; extraocular muscles intact; Fundi without hemorrhages or exudates.  Discs flat  nose without nasal septal hypertrophy Mouth/Parynx benign; Mallinpatti scale 3 Neck: No JVD, no carotid bruits; normal carotid upstroke Lungs: clear to ausculatation and percussion; no wheezing or rales Chest wall: without tenderness to palpitation Heart: PMI not displaced, RRR, s1 s2 normal, 1/6 systolic murmur, no diastolic murmur, no rubs, gallops, thrills, or heaves Abdomen: soft, nontender; no hepatosplenomehaly, BS+; abdominal aorta nontender and not dilated by palpation. Back: no CVA tenderness Pulses 2+ Musculoskeletal: full range of motion, normal strength, no joint  deformities Extremities: no clubbing cyanosis or edema, Homan's sign negative  Neurologic: grossly nonfocal; Cranial nerves grossly wnl Psychologic: Normal mood and affect   Studies/Labs Reviewed:   EKG:  EKG is ordered today.  ECG (independently read by me): Normal sinus rhythm at 62 bpm.  No significant ST-T changes.  Early transition.  QTc interval 389 ms.  Recent Labs: BMP Latest Ref Rng & Units 07/02/2010 07/01/2010 06/30/2010  Glucose 70 - 99 mg/dL 154(H) 203(H) 306(H)  BUN 6 - 23 mg/dL 10 8 14   Creatinine 0.4 - 1.5 mg/dL 1.24 1.11 1.4  Sodium 135 - 145 mEq/L 141 142 138  Potassium 3.5 - 5.1 mEq/L 4.1 4.0 3.7  Chloride 96 - 112 mEq/L 108 106 100  CO2 19 - 32 mEq/L 28 29 -  Calcium 8.4 - 10.5 mg/dL 8.9 9.0 -     Hepatic Function Latest Ref Rng & Units 06/30/2010 06/30/2010  Total Protein 6.0 - 8.3 g/dL 5.8(L) 6.8  Albumin 3.5 - 5.2 g/dL 3.1(L) 3.8  AST 0 - 37 U/L 99(H) 26  ALT 0 - 53 U/L 44 39  Alk Phosphatase 39 - 117 U/L 83 98  Total Bilirubin 0.3 - 1.2 mg/dL 0.5 0.4  Bilirubin, Direct 0.0 - 0.3 mg/dL <0.1 -    CBC Latest Ref Rng & Units 07/02/2010 07/01/2010 06/30/2010  WBC 4.0 - 10.5 K/uL 8.9 12.3(H) -  Hemoglobin 13.0 - 17.0 g/dL 12.8(L) 12.6(L) 15.0  Hematocrit 39.0 - 52.0 % 37.2(L) 37.0(L) 44.0  Platelets 150 - 400 K/uL 330 377 -   Lab Results  Component Value Date   MCV 91.0 07/02/2010   MCV 90.2 07/01/2010   MCV 89.5 06/30/2010   Lab Results  Component Value Date   TSH 1.233 06/30/2010   Lab Results  Component Value Date   HGBA1C (H) 06/30/2010    8.0 (NOTE)  According to the ADA Clinical Practice Recommendations for 2011, when HbA1c is used as a screening test:   >=6.5%   Diagnostic of Diabetes Mellitus           (if abnormal result  is confirmed)  5.7-6.4%   Increased risk of developing Diabetes Mellitus  References:Diagnosis and Classification of Diabetes Mellitus,Diabetes  ZSWF,0932,35(TDDUK 1):S62-S69 and Standards of Medical Care in         Diabetes - 2011,Diabetes GURK,2706,23  (Suppl 1):S11-S61.     BNP No results found for: BNP  ProBNP    Component Value Date/Time   PROBNP <30.0 06/30/2010 2036     Lipid Panel     Component Value Date/Time   CHOL  07/01/2010 0325    192        ATP III CLASSIFICATION:  <200     mg/dL   Desirable  200-239  mg/dL   Borderline High  >=240    mg/dL   High          TRIG 194 (H) 07/01/2010 0325   HDL 30 (L) 07/01/2010 0325   CHOLHDL 6.4 07/01/2010 0325   VLDL 39 07/01/2010 0325   LDLCALC (H) 07/01/2010 0325    123        Total Cholesterol/HDL:CHD Risk Coronary Heart Disease Risk Table                     Men   Women  1/2 Average Risk   3.4   3.3  Average Risk       5.0   4.4  2 X Average Risk   9.6   7.1  3 X Average Risk  23.4   11.0        Use the calculated Patient Ratio above and the CHD Risk Table to determine the patient's CHD Risk.        ATP III CLASSIFICATION (LDL):  <100     mg/dL   Optimal  100-129  mg/dL   Near or Above                    Optimal  130-159  mg/dL   Borderline  160-189  mg/dL   High  >190     mg/dL   Very High     RADIOLOGY: No results found.   Additional studies/ records that were reviewed today include:  I reviewed the patient[facility].  I reviewed recent records from Dr. Jonathon Jordan.  ASSESSMENT:    1. Coronary artery disease involving native coronary artery of native heart without angina pectoris   2. History of MI (myocardial infarction): Inferior STEMI 06/30/2010 with DES stent to RCA   3. Mixed hyperlipidemia   4. Dyspnea on exertion   5. Medication management   6. Family history of tobacco abuse     PLAN:  Mr. Karis Emig is a 52 year old gentleman who suffered an inferior wall myocardial infarction in March 2012 and underwent successful PCI/stenting of a totally occluded RCA.  He was found  to have concomitant CAD.  Subsequent to his MI he was treated aggressively with medical therapy and aggressive lipid-lowering treatment.  He has not been evaluated by cardiology in over 5 years.  He had quit smoking following his myocardial infarction but unfortunately resumed smoking and currently is smoking several cigarettes a day.  I discussed the importance of complete smoking cessation.  His blood pressure today is well controlled on  his regimen consisting of isosorbide 30 mg, lisinopril 5 mg, and metoprolol 25 mg twice a day.  He is diabetic on metformin and recently for CIGA 5 mg was added to his regimen.  He had in the past been on rosuvastatin but apparently over the past years he is currently been taking simvastatin 40 mg.  He is no longer on niacin.  Reportedly in the past he did have an elevated lipoprotein (a).  I reviewed his recent laboratory from [facility].  LDL cholesterol was 89.  I have suggested changing back to rosuvastatin and discussed with him the importance of aggressive therapy.  I will start him on 40 mg daily for high potency therapy and attempt to be very aggressive and hopefully induce plaque regression.  He has noticed exertional fatigue and shortness of breath but denies definitive chest tightness.  I am scheduling him for a exercise Myoview study to assess potential ischemia.  With his change in lipid-lowering therapy I have suggested lipid studies and chemistry profile be obtained in 2 months.  At that time I will also request the LP(a) be obtained.  I will see him in 2 to 3 months for follow-up evaluation.   Medication Adjustments/Labs and Tests Ordered: Current medicines are reviewed at length with the patient today.  Concerns regarding medicines are outlined above.  Medication changes, Labs and Tests ordered today are listed in the Patient Instructions below. Patient Instructions  Medication Instructions:  STOP simvastatin  START rosuvastatin 40 mg daily If you need a  refill on your cardiac medications before your next appointment, please call your pharmacy.   Lab work: Please return for FASTING labs in 2 months (CMET, Lipid)  Our in office lab hours are Monday-Friday 8:00-4:00, closed for lunch 12:45-1:45 pm.  No appointment needed.  If you have labs (blood work) drawn today and your tests are completely normal, you will receive your results only by: Marland Kitchen MyChart Message (if you have MyChart) OR . A paper copy in the mail If you have any lab test that is abnormal or we need to change your treatment, we will call you to review the results.  Testing/Procedures: Your physician has requested that you have an exercise stress myoview. For further information please visit [email or web address]. Please follow instruction sheet, as given.  Follow-Up: At [facility], you and your health needs are our priority.  As part of our continuing mission to provide you with exceptional heart care, we have created designated Provider Care Teams.  These Care Teams include your primary Cardiologist (physician) and Advanced Practice Providers (APPs -  Physician Assistants and Nurse Practitioners) who all work together to provide you with the care you need, when you need it. You will need a follow up appointment in 2-3 months.  Please call our office 2 months in advance to schedule this appointment.  You may see Dr. Claiborne Billings or one of the following Advanced Practice Providers on your designated Care Team: [place], [place] . Fabian Sharp, PA-C       Signed, Shelva Majestic, MD  01/21/2018 8:21 PM    [place] [address] [place], [place], [place]  [address] Phone: [phone]

## 2018-01-19 NOTE — Patient Instructions (Signed)
Medication Instructions:  STOP simvastatin  START rosuvastatin 40 mg daily If you need a refill on your cardiac medications before your next appointment, please call your pharmacy.   Lab work: Please return for FASTING labs in 2 months (CMET, Lipid)  Our in office lab hours are Monday-Friday 8:00-4:00, closed for lunch 12:45-1:45 pm.  No appointment needed.  If you have labs (blood work) drawn today and your tests are completely normal, you will receive your results only by: Marland Kitchen MyChart Message (if you have MyChart) OR . A paper copy in the mail If you have any lab test that is abnormal or we need to change your treatment, we will call you to review the results.  Testing/Procedures: Your physician has requested that you have an exercise stress myoview. For further information please visit [email or web address] Please follow instruction sheet, as given.  Follow-Up: At [facility], you and your health needs are our priority.  As part of our continuing mission to provide you with exceptional heart care, we have created designated Provider Care Teams.  These Care Teams include your primary Cardiologist (physician) and Advanced Practice Providers (APPs -  Physician Assistants and Nurse Practitioners) who all work together to provide you with the care you need, when you need it. You will need a follow up appointment in 2-3 months.  Please call our office 2 months in advance to schedule this appointment.  You may see Dr. Tresa Endo or one of the following Advanced Practice Providers on your designated Care Team: [place], [place] . Micah Flesher, PA-C

## 2018-01-21 ENCOUNTER — Encounter: Payer: Self-pay | Admitting: Cardiovascular Disease

## 2018-01-27 ENCOUNTER — Telehealth (HOSPITAL_COMMUNITY): Payer: Self-pay

## 2018-01-27 NOTE — Telephone Encounter (Signed)
Encounter complete. 

## 2018-01-29 ENCOUNTER — Ambulatory Visit (HOSPITAL_COMMUNITY)
Admission: RE | Admit: 2018-01-29 | Discharge: 2018-01-29 | Disposition: A | Discharge status: Home / Self Care | Payer: Managed Care, Other (non HMO) | Source: Ambulatory Visit | Attending: Cardiology | Admitting: Cardiology

## 2018-01-29 DIAGNOSIS — I2510 Atherosclerotic heart disease of native coronary artery without angina pectoris: Secondary | ICD-10-CM

## 2018-01-29 DIAGNOSIS — R0609 Other forms of dyspnea: Secondary | ICD-10-CM | POA: Diagnosis present

## 2018-01-29 LAB — MYOCARDIAL PERFUSION IMAGING
CHL CUP NUCLEAR SRS: 1
CHL RATE OF PERCEIVED EXERTION: 17
CSEPEW: 14 METS
Exercise duration (min): 12 min
Exercise duration (sec): 20 s
LVDIAVOL: 81 mL (ref 62–150)
LVSYSVOL: 30 mL
MPHR: 168 {beats}/min
Peak HR: 157 {beats}/min
Percent HR: 93 %
Rest HR: 58 {beats}/min
SDS: 1
SSS: 2
TID: 1.25

## 2018-01-29 MED ORDER — TECHNETIUM TC 99M TETROFOSMIN IV KIT
32.0000 | PACK | Freq: Once | INTRAVENOUS | Status: AC | PRN
  Administered 2018-01-29: 32 via INTRAVENOUS
  Filled 2018-01-29: qty 32

## 2018-01-29 MED ORDER — TECHNETIUM TC 99M TETROFOSMIN IV KIT
9.8000 | PACK | Freq: Once | INTRAVENOUS | Status: AC | PRN
  Administered 2018-01-29: 9.8 via INTRAVENOUS
  Filled 2018-01-29: qty 10

## 2018-04-22 ENCOUNTER — Encounter: Payer: Self-pay | Admitting: Cardiovascular Disease

## 2018-04-22 ENCOUNTER — Ambulatory Visit: Payer: Managed Care, Other (non HMO) | Admitting: Cardiovascular Disease

## 2018-04-22 VITALS — BP 118/74 | HR 76 | Ht 70.0 in | Wt 186.0 lb

## 2018-04-22 DIAGNOSIS — I252 Old myocardial infarction: Secondary | ICD-10-CM

## 2018-04-22 DIAGNOSIS — Z79899 Other long term (current) drug therapy: Secondary | ICD-10-CM | POA: Diagnosis not present

## 2018-04-22 DIAGNOSIS — E782 Mixed hyperlipidemia: Secondary | ICD-10-CM

## 2018-04-22 DIAGNOSIS — I2510 Atherosclerotic heart disease of native coronary artery without angina pectoris: Secondary | ICD-10-CM | POA: Diagnosis not present

## 2018-04-22 DIAGNOSIS — Z720 Tobacco use: Secondary | ICD-10-CM

## 2018-04-22 DIAGNOSIS — E7841 Elevated Lipoprotein(a): Secondary | ICD-10-CM

## 2018-04-22 NOTE — Patient Instructions (Signed)
Medication Instructions:  The current medical regimen is effective;  continue present plan and medications.  If you need a refill on your cardiac medications before your next appointment, please call your pharmacy.   Lab work: LPa, CMET, LIPID, A1C If you have labs (blood work) drawn today and your tests are completely normal, you will receive your results only by: Marland Kitchen MyChart Message (if you have MyChart) OR . A paper copy in the mail If you have any lab test that is abnormal or we need to change your treatment, we will call you to review the results.   Follow-Up: At [facility], you and your health needs are our priority.  As part of our continuing mission to provide you with exceptional heart care, we have created designated Provider Care Teams.  These Care Teams include your primary Cardiologist (physician) and Advanced Practice Providers (APPs -  Physician Assistants and Nurse Practitioners) who all work together to provide you with the care you need, when you need it. You will need a follow up appointment in 12 months.  Please call our office 2 months in advance to schedule this appointment.  You may see Dr.Kelly or one of the following Advanced Practice Providers on your designated Care Team: [place], [place] . Micah Flesher, PA-C

## 2018-04-22 NOTE — Progress Notes (Signed)
Cardiology Office Note    Date:  04/24/2018   ID:  Dennis Wilcox, DOB [birth year], MRN [id number]  PCP:  Jonathon Jordan, MD  Cardiologist:  Shelva Majestic, MD   F/U Cardiology evaluation initially referred through the courtesy of Dr. Jonathon Jordan for reestablishment of cardiology care.  History of Present Illness:  Dennis Wilcox is a 53 y.o. male who has a history of prior myocardial infarction in 2012.  He had not been evaluated by cardiology since 2014.  He was  evaluated by Dr. Morton Stall and due to symptoms of exertional fatigue and shortness of breath he was referred for cardiology evaluation.  I saw him for initial evaluation on January 19, 2018.  He presents for 88-monthfollow-up evaluation.  [name] an inferior wall myocardial infarction on June 30, 2010 and underwent successful PCI with stenting to a totally occluded RCA.  He had concomitant three-vessel CAD with segmental disease in his LAD of 60% proximal 60% mid and 80% distal lesions.  He had a 50% AV groove stenosis and 50% distal circumflex stenosis.  His RCA was successfully stented which resulted in significant myocardial salvage.  Initially he had been treated with aggressive therapy and during his initial evaluations with me [facility] which showed marked abnormalities.  He also had a increased LPA and had been on combination therapy with niacin in addition to rosuvastatin.  His last nuclear perfusion study was in May 2013 showed normal perfusion without scar or ischemia.  Over the past 6 years he has continued to do fairly well.  However, he has noticed some exertional fatigue and exertional shortness of breath.  He is active and does yard work.  He works as a sHotel manager  He was diagnosed with diabetes mellitus at age 53   He has been on isosorbide 30 mg, lisinopril 5 mg, metoprolol 25 mg twice a day for hypertension and anti-ischemic benefit.  He apparently has been on  simvastatin 40 mg and was no longer on rosuvastatin.  He is no longer taking niacin.    I saw him for initial cardiology evaluation 03/21/2018 pressure was controlled on isosorbide, lisinopril and metoprolol.  Started on an additional agent in combination with metformin by his primary for his diabetes mellitus.  Due to his history of elevated LP(a) and LDL of 89 I suggested adding him on rosuvastatin for more high potency statin therapy.  He had noted his exertional fatigue and shortness of breath.  Went an exercise Myoview study on January 29, 2018 and EF of 62% and normal perfusion.  I had recommended follow-up laboratory but this had not been done.  Has recurrent chest pain PND orthopnea.  He feels well.  He presents for reevaluation.   Past Medical History:  Diagnosis Date  . Hyperlipemia 08/06/2010   [facility] completed ;repeated Feb 2013  . [place]   infer wall MI, cath 3 vessel CAD    Past Surgical History:  Procedure Laterality Date  . CARDIAC CATHETERIZATION  06/30/2010   Emergent  . CORONARY ANGIOPLASTY WITH STENT PLACEMENT  06/30/2010   DES of RCA;LAD 60% prox,60%mid, 80% distal; 50% AVgroove stenosis, 50%distal circ stenosis  . R/S myocardial perfusion  08/21/2011   EF72%     Current Medications: Outpatient Medications Prior to Visit  Medication Sig Dispense Refill  . aspirin EC 81 MG tablet Take 81 mg by mouth daily.    . isosorbide mononitrate (IMDUR) 30 MG  24 hr tablet Take 30 mg by mouth daily.     Marland Kitchen lisinopril (PRINIVIL,ZESTRIL) 5 MG tablet Take 5 mg by mouth daily.    . metFORMIN (GLUCOPHAGE) 500 MG tablet Take 500 mg by mouth 2 (two) times daily with a meal.    . metoprolol tartrate (LOPRESSOR) 25 MG tablet Take 1 tablet (25 mg total) by mouth 2 (two) times daily. NEED APPOINTMENT FOR FUTURE REFILLS. 30 tablet 0  . niacin (NIASPAN) 500 MG CR tablet Take 1 tablet (500 mg total) by mouth at bedtime. 30 tablet 11  . nitroGLYCERIN  (NITROSTAT) 0.4 MG SL tablet Place 0.4 mg under the tongue every 5 (five) minutes as needed for chest pain.    Marland Kitchen omeprazole (PRILOSEC) 20 MG capsule Take 20 mg by mouth daily.    . rosuvastatin (CRESTOR) 40 MG tablet Take 1 tablet (40 mg total) by mouth daily. 90 tablet 3  . venlafaxine XR (EFFEXOR-XR) 75 MG 24 hr capsule Take 75 mg by mouth daily.     No facility-administered medications prior to visit.      Allergies:   Novocain [procaine hcl]   Social History   Socioeconomic History  . Marital status: Single    Spouse name: Not on file  . Number of children: Not on file  . Years of education: Not on file  . Highest education level: Not on file  Occupational History  . Not on file  Social Needs  . Financial resource strain: Not on file  . Food insecurity:    Worry: Not on file    Inability: Not on file  . Transportation needs:    Medical: Not on file    Non-medical: Not on file  Tobacco Use  . Smoking status: Former Smoker    Types: Cigarettes    Last attempt to quit: 04/07/2010    Years since quitting: 8.0  . Smokeless tobacco: Never Used  Substance and Sexual Activity  . Alcohol use: No  . Drug use: Not on file  . Sexual activity: Not on file  Lifestyle  . Physical activity:    Days per week: Not on file    Minutes per session: Not on file  . Stress: Not on file  Relationships  . Social connections:    Talks on phone: Not on file    Gets together: Not on file    Attends religious service: Not on file    Active member of club or organization: Not on file    Attends meetings of clubs or organizations: Not on file    Relationship status: Not on file  Other Topics Concern  . Not on file  Social History Narrative  . Not on file    Socially he is single.  He has 1 child.  He lives with his significant other and mother.  He works in Press photographer for [employer], and altered.  He has a BS degree.  He had smoked for 23 years and then quit for 5 years.  Apparently he had then began  to date a woman who smoked and again resumed smoking.  Currently he is smoking 3 to 4 cigarettes/day.  He does drink occasional beer.  He does not routinely exercise.  Family History:  The patient's family history is not on file.  His mother is living at age 35 and has dementia.  Father died at age 57 and had diabetes and heart disease.  He has a deceased sister.  He has 1 child age 106.  ROS General: Negative; No fevers, chills, or night sweats;  HEENT: Negative; No changes in vision or hearing, sinus congestion, difficulty swallowing Pulmonary: Negative; No cough, wheezing, shortness of breath, hemoptysis Cardiovascular: See HPI GI: Negative; No nausea, vomiting, diarrhea, or abdominal pain GU: Negative; No dysuria, hematuria, or difficulty voiding Musculoskeletal: Negative; no myalgias, joint pain, or weakness Hematologic/Oncology: Negative; no easy bruising, bleeding Endocrine: Negative; no heat/cold intolerance; no diabetes Neuro: Negative; no changes in balance, headaches Skin: Negative; No rashes or skin lesions Psychiatric: Negative; No behavioral problems, depression Sleep: Negative; No snoring, daytime sleepiness, hypersomnolence, bruxism, restless legs, hypnogognic hallucinations, no cataplexy Other comprehensive 14 point system review is negative.   PHYSICAL EXAM:   VS:  BP 118/74 (BP Location: Left Arm, Patient Position: Sitting, Cuff Size: Normal)   Pulse 76   Ht 5' 10"  (1.778 m)   Wt 186 lb (84.4 kg)   BMI 26.69 kg/m     Blood pressure by me 122/76  Wt Readings from Last 3 Encounters:  04/22/18 186 lb (84.4 kg)  01/29/18 186 lb (84.4 kg)  01/19/18 186 lb (84.4 kg)    General: Alert, oriented, no distress.  Skin: normal turgor, no rashes, warm and dry HEENT: Normocephalic, atraumatic. Pupils equal round and reactive to light; sclera anicteric; extraocular muscles intact;  Nose without nasal septal hypertrophy Mouth/Parynx benign; Mallinpatti scale 3 Neck: No JVD,  no carotid bruits; normal carotid upstroke Lungs: clear to ausculatation and percussion; no wheezing or rales Chest wall: without tenderness to palpitation Heart: PMI not displaced, RRR, s1 s2 normal, 1/6 systolic murmur, no diastolic murmur, no rubs, gallops, thrills, or heaves Abdomen: soft, nontender; no hepatosplenomehaly, BS+; abdominal aorta nontender and not dilated by palpation. Back: no CVA tenderness Pulses 2+ Musculoskeletal: full range of motion, normal strength, no joint deformities Extremities: no clubbing cyanosis or edema, Homan's sign negative  Neurologic: grossly nonfocal; Cranial nerves grossly wnl Psychologic: Normal mood and affect    Studies/Labs Reviewed:   EKG:  EKG is ordered today. ECG (independently read by me): Normal sinus rhythm at 76 bpm.  Incomplete right bundle branch block.  January 19, 2018 ECG (independently read by me): Normal sinus rhythm at 62 bpm.  No significant ST-T changes.  Early transition.  QTc interval 389 ms.  Recent Labs: BMP Latest Ref Rng & Units 07/02/2010 07/01/2010 06/30/2010  Glucose 70 - 99 mg/dL 154(H) 203(H) 306(H)  BUN 6 - 23 mg/dL 10 8 14   Creatinine 0.4 - 1.5 mg/dL 1.24 1.11 1.4  Sodium 135 - 145 mEq/L 141 142 138  Potassium 3.5 - 5.1 mEq/L 4.1 4.0 3.7  Chloride 96 - 112 mEq/L 108 106 100  CO2 19 - 32 mEq/L 28 29 -  Calcium 8.4 - 10.5 mg/dL 8.9 9.0 -     Hepatic Function Latest Ref Rng & Units 06/30/2010 06/30/2010  Total Protein 6.0 - 8.3 g/dL 5.8(L) 6.8  Albumin 3.5 - 5.2 g/dL 3.1(L) 3.8  AST 0 - 37 U/L 99(H) 26  ALT 0 - 53 U/L 44 39  Alk Phosphatase 39 - 117 U/L 83 98  Total Bilirubin 0.3 - 1.2 mg/dL 0.5 0.4  Bilirubin, Direct 0.0 - 0.3 mg/dL <0.1 -    CBC Latest Ref Rng & Units 07/02/2010 07/01/2010 06/30/2010  WBC 4.0 - 10.5 K/uL 8.9 12.3(H) -  Hemoglobin 13.0 - 17.0 g/dL 12.8(L) 12.6(L) 15.0  Hematocrit 39.0 - 52.0 % 37.2(L) 37.0(L) 44.0  Platelets 150 - 400 K/uL 330 377 -   Lab Results  Component Value Date     MCV 91.0 07/02/2010   MCV 90.2 07/01/2010   MCV 89.5 06/30/2010   Lab Results  Component Value Date   TSH 1.233 06/30/2010   Lab Results  Component Value Date   HGBA1C (H) 06/30/2010    8.0 (NOTE)                                                                       According to the ADA Clinical Practice Recommendations for 2011, when HbA1c is used as a screening test:   >=6.5%   Diagnostic of Diabetes Mellitus           (if abnormal result  is confirmed)  5.7-6.4%   Increased risk of developing Diabetes Mellitus  References:Diagnosis and Classification of Diabetes Mellitus,Diabetes RDEY,8144,81(EHUDJ 1):S62-S69 and Standards of Medical Care in         Diabetes - 2011,Diabetes Care,2011,34  (Suppl 1):S11-S61.     BNP No results found for: BNP  ProBNP    Component Value Date/Time   PROBNP <30.0 06/30/2010 2036     Lipid Panel     Component Value Date/Time   CHOL  07/01/2010 0325    192        ATP III CLASSIFICATION:  <200     mg/dL   Desirable  200-239  mg/dL   Borderline High  >=240    mg/dL   High          TRIG 194 (H) 07/01/2010 0325   HDL 30 (L) 07/01/2010 0325   CHOLHDL 6.4 07/01/2010 0325   VLDL 39 07/01/2010 0325   LDLCALC (H) 07/01/2010 0325    123        Total Cholesterol/HDL:CHD Risk Coronary Heart Disease Risk Table                     Men   Women  1/2 Average Risk   3.4   3.3  Average Risk       5.0   4.4  2 X Average Risk   9.6   7.1  3 X Average Risk  23.4   11.0        Use the calculated Patient Ratio above and the CHD Risk Table to determine the patient's CHD Risk.        ATP III CLASSIFICATION (LDL):  <100     mg/dL   Optimal  100-129  mg/dL   Near or Above                    Optimal  130-159  mg/dL   Borderline  160-189  mg/dL   High  >190     mg/dL   Very High     RADIOLOGY: No results found.   Additional studies/ records that were reviewed today include:  I reviewed the patient[facility].  I reviewed recent records from Dr. Jonathon Jordan.  ASSESSMENT:    1. Mixed hyperlipidemia   2. Coronary artery disease involving native coronary artery of native heart without angina pectoris   3. History of MI (myocardial infarction): Inferior STEMI 06/30/2010 with DES stent to RCA   4. Medication management  5. Elevated lipoprotein A level   6. Tobacco abuse     PLAN:  Mr. Ahmeer Tuman is a 53 year old gentleman who suffered an inferior wall myocardial infarction in March 2012 and underwent successful PCI/stenting of a totally occluded RCA.  He was found to have concomitant CAD.  Subsequent to his MI he was treated aggressively with medical therapy and aggressive lipid-lowering treatment.  Prior ot my evaluation in October 2015 he had not been seen by cardiologist in over 5 years.  He had quit smoking following his myocardial infarction but unfortunately resumed smoking and currently is smoking several cigarettes a day.  I discussed the importance of complete smoking cessation.  He continues to be on Isoorbide 30 mg, lisinopril 5 mg, metoprolol 25 mg twice a day for hypertension.  Blood pressure today is stable. I reviewed his nuclear study which is low risk and shows normal perfusion with EF 62%. He has significant hyperlipidemia and in past has had elevated Lp(a). I will recheck labs including CMET, FLP, Lp(a) and HbA1c. I will contact him with the results and ajustment to medication will be made if necessary. He is on metformin and another agent for his DM followed by Dr. Stephanie Acre. I ecomeended exercise at least 5 days per week for a minimium of 30 minutes of moderate intensity if possible.    Medication Adjustments/Labs and Tests Ordered: Current medicines are reviewed at length with the patient today.  Concerns regarding medicines are outlined above.  Medication changes, Labs and Tests ordered today are listed in the Patient Instructions below. Patient Instructions    Medication Instructions:  The current medical regimen is effective;  continue present plan and medications.  If you need a refill on your cardiac medications before your next appointment, please call your pharmacy.   Lab work: LPa, CMET, LIPID, A1C If you have labs (blood work) drawn today and your tests are completely normal, you will receive your results only by: Marland Kitchen MyChart Message (if you have MyChart) OR . A paper copy in the mail If you have any lab test that is abnormal or we need to change your treatment, we will call you to review the results.   Follow-Up: At [facility], you and your health needs are our priority.  As part of our continuing mission to provide you with exceptional heart care, we have created designated Provider Care Teams.  These Care Teams include your primary Cardiologist (physician) and Advanced Practice Providers (APPs -  Physician Assistants and Nurse Practitioners) who all work together to provide you with the care you need, when you need it. You will need a follow up appointment in 12 months.  Please call our office 2 months in advance to schedule this appointment.  You may see Dr.Destony Prevost or one of the following Advanced Practice Providers on your designated Care Team: Almyra Deforest, [place] . Fabian Sharp, PA-C        Signed, Shelva Majestic, MD  04/24/2018 5:06 PM    [facility] [address], [place], [place], [place]  [address] Phone: [phone]

## 2018-04-24 ENCOUNTER — Encounter: Payer: Self-pay | Admitting: Cardiovascular Disease

## 2018-06-29 ENCOUNTER — Telehealth: Payer: Self-pay

## 2018-06-29 NOTE — Telephone Encounter (Signed)
error 

## 2019-07-29 ENCOUNTER — Telehealth: Payer: Self-pay

## 2019-07-29 ENCOUNTER — Encounter: Payer: Self-pay | Admitting: Cardiovascular Disease

## 2019-07-29 ENCOUNTER — Telehealth (INDEPENDENT_AMBULATORY_CARE_PROVIDER_SITE_OTHER): Payer: 59 | Admitting: Cardiovascular Disease

## 2019-07-29 VITALS — Ht 70.0 in | Wt 175.0 lb

## 2019-07-29 DIAGNOSIS — E782 Mixed hyperlipidemia: Secondary | ICD-10-CM | POA: Diagnosis not present

## 2019-07-29 DIAGNOSIS — E7841 Elevated Lipoprotein(a): Secondary | ICD-10-CM

## 2019-07-29 DIAGNOSIS — I252 Old myocardial infarction: Secondary | ICD-10-CM

## 2019-07-29 DIAGNOSIS — Z87891 Personal history of nicotine dependence: Secondary | ICD-10-CM

## 2019-07-29 DIAGNOSIS — Z7984 Long term (current) use of oral hypoglycemic drugs: Secondary | ICD-10-CM

## 2019-07-29 DIAGNOSIS — Z79899 Other long term (current) drug therapy: Secondary | ICD-10-CM

## 2019-07-29 DIAGNOSIS — Z955 Presence of coronary angioplasty implant and graft: Secondary | ICD-10-CM

## 2019-07-29 DIAGNOSIS — E118 Type 2 diabetes mellitus with unspecified complications: Secondary | ICD-10-CM

## 2019-07-29 DIAGNOSIS — I2510 Atherosclerotic heart disease of native coronary artery without angina pectoris: Secondary | ICD-10-CM | POA: Diagnosis not present

## 2019-07-29 DIAGNOSIS — E119 Type 2 diabetes mellitus without complications: Secondary | ICD-10-CM

## 2019-07-29 NOTE — Telephone Encounter (Signed)
  Patient Consent for Virtual Visit         Dennis Wilcox has provided verbal consent on 07/29/2019 for a virtual visit (video or telephone).   CONSENT FOR VIRTUAL VISIT FOR:  Dennis Wilcox  By participating in this virtual visit I agree to the following:  I hereby voluntarily request, consent and authorize [facility] and its employed or contracted physicians, physician assistants, nurse practitioners or other licensed health care professionals (the Practitioner), to provide me with telemedicine health care services (the "Services") as deemed necessary by the treating Practitioner. I acknowledge and consent to receive the Services by the Practitioner via telemedicine. I understand that the telemedicine visit will involve communicating with the Practitioner through live audiovisual communication technology and the disclosure of certain medical information by electronic transmission. I acknowledge that I have been given the opportunity to request an in-person assessment or other available alternative prior to the telemedicine visit and am voluntarily participating in the telemedicine visit.  I understand that I have the right to withhold or withdraw my consent to the use of telemedicine in the course of my care at any time, without affecting my right to future care or treatment, and that the Practitioner or I may terminate the telemedicine visit at any time. I understand that I have the right to inspect all information obtained and/or recorded in the course of the telemedicine visit and may receive copies of available information for a reasonable fee.  I understand that some of the potential risks of receiving the Services via telemedicine include:  Marland Kitchen Delay or interruption in medical evaluation due to technological equipment failure or disruption; . Information transmitted may not be sufficient (e.g. poor resolution of images) to allow for appropriate medical decision making by the  Practitioner; and/or  . In rare instances, security protocols could fail, causing a breach of personal health information.  Furthermore, I acknowledge that it is my responsibility to provide information about my medical history, conditions and care that is complete and accurate to the best of my ability. I acknowledge that Practitioner's advice, recommendations, and/or decision may be based on factors not within their control, such as incomplete or inaccurate data provided by me or distortions of diagnostic images or specimens that may result from electronic transmissions. I understand that the practice of medicine is not an exact science and that Practitioner makes no warranties or guarantees regarding treatment outcomes. I acknowledge that a copy of this consent can be made available to me via my patient portal [facility] MyChart), or I can request a printed copy by calling the office of [facility].    I understand that my insurance will be billed for this visit.   I have read or had this consent read to me. . I understand the contents of this consent, which adequately explains the benefits and risks of the Services being provided via telemedicine.  . I have been provided ample opportunity to ask questions regarding this consent and the Services and have had my questions answered to my satisfaction. . I give my informed consent for the services to be provided through the use of telemedicine in my medical care

## 2019-07-29 NOTE — Progress Notes (Signed)
Virtual Visit via Video Note   This visit type was conducted due to national recommendations for restrictions regarding the COVID-19 Pandemic (e.g. social distancing) in an effort to limit this patient's exposure and mitigate transmission in our community.  Due to his co-morbid illnesses, this patient is at least at moderate risk for complications without adequate follow up.  This format is felt to be most appropriate for this patient at this time.  All issues noted in this document were discussed and addressed.  A limited physical exam was performed with this format.  Please refer to the patient's chart for his consent to telehealth for [facility].   The patient was identified using 2 identifiers.  Date:  07/29/2019   ID:  Dennis Wilcox, DOB [birth year], MRN [id number]  Patient Location: Home Provider Location: Home  PCP:  Dennis Jordan, MD  Cardiologist: Dennis Majestic, MD Electrophysiologist:  None   Evaluation Performed:  Follow-Up Visit  Chief Complaint:  15 month F/U  History of Present Illness:    Dennis Wilcox is a 54 y.o. male  who has a history of prior myocardial infarction in 2012.  He had not been evaluated by cardiology since 2014.  He was  evaluated by Dr. Morton Wilcox and due to symptoms of exertional fatigue and shortness of breath he was referred for cardiology evaluation.  I saw him for initial evaluation on January 19, 2018.    Dennis Wilcox suffered an inferior wall myocardial infarction on June 30, 2010 and underwent successful PCI with stenting to a totally occluded RCA.  He had concomitant three-vessel CAD with segmental disease in his LAD of 60% proximal 60% mid and 80% distal lesions.  He had a 50% AV groove stenosis and 50% distal circumflex stenosis.  His RCA was successfully stented which resulted in significant myocardial salvage.  Initially he had been treated with aggressive therapy and during his initial evaluations with me I also performed [facility]  heart lab assessment which showed marked abnormalities.  He also had a increased LPA and had been on combination therapy with niacin in addition to rosuvastatin.  His last nuclear perfusion study was in May 2013 showed normal perfusion without scar or ischemia.  When I saw him in 2019 and over the previous 6 years he had continued to do fairly well.   However, he has noticed some exertional fatigue and exertional shortness of breath.  He is active and does yard work.  He works as a Hotel manager.  He was diagnosed with diabetes mellitus at age 43.   He has been on isosorbide 30 mg, lisinopril 5 mg, metoprolol 25 mg twice a day for hypertension and anti-ischemic benefit.  He apparently was on simvastatin 40 mg and was no longer on rosuvastatin or niacin.  I saw him for initial cardiology evaluation 03/21/2018 pressure was controlled on isosorbide, lisinopril and metoprolol.  Started on an additional agent in combination with metformin by his primary for his diabetes mellitus.  Due to his history of elevated LP(a) and LDL of 89 I suggested adding him on rosuvastatin for more high potency statin therapy.  He had noted his exertional fatigue and shortness of breath.  He underwent an exercise Myoview study on January 29, 2018 and EF of 62% and normal perfusion.  I last saw him in follow-up in January 2020.  He has continued to feel well.  He has continued to be followed by Dennis Wilcox.  When I had seen him I recommended ordering laboratory.  He never had this done through me.  In the past he also had elevated LPa a which I have recommended to be rechecked.    He had laboratory done at [facility] in August 2020 .  At that time, total cholesterol was 177, triglycerides were elevated at 258, LDL was 91 and non-HDL was 143.  He thinks at that time he was put back on low-dose rosuvastatin at 10 mg.  Since I saw him he also was started on Farxiga 10 mg for his diabetes mellitus in August 2020 hemoglobin A1c was 8.3.  He had  normal renal function.  He called the office to arrange his appointment since approximately 3 weeks ago he came down with a stomach bug and had nausea and some fatigability.  He was concerned perhaps this may be related in someway to his heart but ultimately he always GI.  During this period due to his nausea appetite was markedly reduced and he lost approximately 10 pounds with from a weight of 186 down to 175.  He presents today in a telemedicine visit for full evaluation.   The patient does not have symptoms concerning for COVID-19 infection (fever, chills, cough, or new shortness of breath).    Past Medical History:  Diagnosis Date  . Hyperlipemia 08/06/2010   [facility] completed ;repeated Feb 2013  . Myocardial infarction (HCC) 06/30/2010   infer wall MI, cath 3 vessel CAD   Past Surgical History:  Procedure Laterality Date  . CARDIAC CATHETERIZATION  06/30/2010   Emergent  . CORONARY ANGIOPLASTY WITH STENT PLACEMENT  06/30/2010   DES of RCA;LAD 60% prox,60%mid, 80% distal; 50% AVgroove stenosis, 50%distal circ stenosis  . R/S myocardial perfusion  08/21/2011   EF72%      Current Meds  Medication Sig  . aspirin EC 81 MG tablet Take 81 mg by mouth daily.  . isosorbide mononitrate (IMDUR) 30 MG 24 hr tablet Take 30 mg by mouth daily.   Marland Kitchen lisinopril (PRINIVIL,ZESTRIL) 5 MG tablet Take 5 mg by mouth daily.  . metFORMIN (GLUCOPHAGE) 500 MG tablet Take 500 mg by mouth 2 (two) times daily with a meal.  . metoprolol tartrate (LOPRESSOR) 25 MG tablet Take 1 tablet (25 mg total) by mouth 2 (two) times daily. NEED APPOINTMENT FOR FUTURE REFILLS.  Marland Kitchen nitroGLYCERIN (NITROSTAT) 0.4 MG SL tablet Place 0.4 mg under the tongue every 5 (five) minutes as needed for chest pain.  Marland Kitchen omeprazole (PRILOSEC) 20 MG capsule Take 20 mg by mouth daily.  Marland Kitchen venlafaxine XR (EFFEXOR-XR) 75 MG 24 hr capsule Take 75 mg by mouth daily.     Allergies:   Novocain [procaine hcl]   Social History   Tobacco Use   . Smoking status: Former Smoker    Types: Cigarettes    Quit date: 04/07/2010    Years since quitting: 9.3  . Smokeless tobacco: Never Used  Substance Use Topics  . Alcohol use: No  . Drug use: Not on file     Family Hx: The patient's family history is not on file.  ROS:   Please see the history of present illness.    No fevers chills or night sweats Recent 10 pound weight loss secondary to a "stomach bug " No palpitations No exertional chest tightness Positive for diabetes mellitus with recent addition to [place] to his Metformin Mixed hyperlipidemia Remote history of anxiety for which she was placed on Effexor many years ago.  He has reduced this now to 75 mg which he  takes every other day. No neurologic symptoms Sleeping well All other systems reviewed and are negative.   Prior CV studies:   The following studies were reviewed today:  I reviewed the patient's prior records.  I reviewed laboratory from [facility].  Labs/Other Tests and Data Reviewed:    EKG: I personally reviewed his ECG from April 22, 2018 which showed normal sinus rhythm at 76 bpm.  There was incomplete right bundle branch block.  There was no ectopy.  Recent Labs: No results found for requested labs within last 8760 hours.   Recent Lipid Panel Lab Results  Component Value Date/Time   CHOL  07/01/2010 03:25 AM    192        ATP III CLASSIFICATION:  <200     mg/dL   Desirable  701-779  mg/dL   Borderline High  >=390    mg/dL   High          TRIG 300 (H) 07/01/2010 03:25 AM   HDL 30 (L) 07/01/2010 03:25 AM   CHOLHDL 6.4 07/01/2010 03:25 AM   LDLCALC (H) 07/01/2010 03:25 AM    123        Total Cholesterol/HDL:CHD Risk Coronary Heart Disease Risk Table                     Men   Women  1/2 Average Risk   3.4   3.3  Average Risk       5.0   4.4  2 X Average Risk   9.6   7.1  3 X Average Risk  23.4   11.0        Use the calculated Patient Ratio above and the CHD Risk Table to determine the  patient's CHD Risk.        ATP III CLASSIFICATION (LDL):  <100     mg/dL   Optimal  923-300  mg/dL   Near or Above                    Optimal  130-159  mg/dL   Borderline  762-263  mg/dL   High  >335     mg/dL   Very High    Wt Readings from Last 3 Encounters:  07/29/19 175 lb (79.4 kg)  04/22/18 186 lb (84.4 kg)  01/29/18 186 lb (84.4 kg)     Objective:    Vital Signs:  Ht 5\' 10"  (1.778 m)   Wt 175 lb (79.4 kg)   BMI 25.11 kg/m    This was a video visit. Physical appearance was stable. He appears at optimal body weight  No JVD No audible wheezing Rhythm regular without palpitations No abdominal discomfort to his palpation No edema No neurologic symptoms Normal affect and mood  ASSESSMENT & PLAN:    1. CAD: History of inferior wall MI June 30, 2010.  He underwent successful stenting to a totally occluded RCA.  There was concomitant three-vessel CAD with segmental disease in his LAD with 60% proximally, 60% mid and 80% distal lesions.  The left circumflex had 50% AV groove stenosis and 50% distal circumflex stenosis.  RCA was successfully stented which resulted in significant myocardial salvage.  He called the office because he was concerned that some of his symptoms 3 weeks ago made it clear.  However with time he realized his symptoms were GI in origin.  He is to be on isosorbide 30 mg and metoprolol daily.  There are no exertional anginal symptoms.  His last stress test was in 2010 which I reviewed with him today.  I discussed with him importance of aggressive lipid therapy to induce plaque regression.  In the past he was noted to have an elevated LP(a). 2. Essential hypertension: He continues to be on lisinopril in addition to metoprolol and isosorbide.  Blood pressure has been stable Dr. Paulino Rily. 3. Mixed hyperlipidemia: I reviewed his most recent lipid panel.  With his diabetes mellitus and CAD target LDL is less than 60.  I also reviewed with him today data regarding  aggressive lipid-lowering therapy resulting in plaque regression.  However he will need to have LDL cholesterols in the 60s and preferably lower to reduce regression.  He has not had laboratory since August 2020.  I will check a comprehensive metabolic panel, lipid panel in addition to LP(a).  He may benefit also from the addition of Vascepa level weight for his recent blood work to be completed particularly with his recent weight loss. 4. Type 2 diabetes mellitus.  He is now on Farxiga 10 mg in addition to Metformin.  I discussed with him dated today regarding Farxiga and cardiovascular benefit particularly with CHF. 5. Anxiety: He has been on Effexor for over 20+ years.  He has most recently been taking his Effexor at 75 mg every other day.  He had mentioned about the possibility of coming off this with his primary provider.  I discussed with him perhaps that he can discuss with her reducing the dose to 37.5 mg and taking this every other day with ultimate plans to wean and discontinue slowly due to his long duration of treatment.  COVID-19 Education: The signs and symptoms of COVID-19 were discussed with the patient and how to seek care for testing (follow up with PCP or arrange E-visit).  The importance of social distancing was discussed today.  Time:   Today, I have spent 24 minutes with the patient with telehealth technology discussing the above problems.     Medication Adjustments/Labs and Tests Ordered: Current medicines are reviewed at length with the patient today.  Concerns regarding medicines are outlined above.   Tests Ordered: No orders of the defined types were placed in this encounter.   Medication Changes: No orders of the defined types were placed in this encounter.   Follow Up: In person follow-up office visit in 6 months.  Signed, Nicki Guadalajara, MD  07/29/2019 8:42 AM    [facility]

## 2019-07-29 NOTE — Patient Instructions (Signed)
Medication Instructions:  CONTINUE WITH CURRENT MEDICATIONS. NO CHANGES.  *If you need a refill on your cardiac medications before your next appointment, please call your pharmacy*   Lab Work:FASTING LABS CMET LIPID LPa If you have labs (blood work) drawn today and your tests are completely normal, you will receive your results only by: Marland Kitchen MyChart Message (if you have MyChart) OR . A paper copy in the mail If you have any lab test that is abnormal or we need to change your treatment, we will call you to review the results.     Follow-Up: At [facility], you and your health needs are our priority.  As part of our continuing mission to provide you with exceptional heart care, we have created designated Provider Care Teams.  These Care Teams include your primary Cardiologist (physician) and Advanced Practice Providers (APPs -  Physician Assistants and Nurse Practitioners) who all work together to provide you with the care you need, when you need it.  We recommend signing up for the patient portal called "MyChart".  Sign up information is provided on this After Visit Summary.  MyChart is used to connect with patients for Virtual Visits (Telemedicine).  Patients are able to view lab/test results, encounter notes, upcoming appointments, etc.  Non-urgent messages can be sent to your provider as well.   To learn more about what you can do with MyChart, go to [email or web address].    Your next appointment:   6 month(s)  The format for your next appointment:   In Person  Provider:   Nicki Guadalajara, MD

## 2019-07-29 NOTE — Telephone Encounter (Signed)
Called and reviewed AVS with pt. Verbalized understanding with no other questions at this time. Updated address for pt given [address] [place], [place] [address]  Will mail lab slips and AVS

## 2020-01-30 ENCOUNTER — Telehealth (INDEPENDENT_AMBULATORY_CARE_PROVIDER_SITE_OTHER): Payer: 59 | Admitting: Physician Assistant

## 2020-01-30 ENCOUNTER — Encounter: Payer: Self-pay | Admitting: Physician Assistant

## 2020-01-30 ENCOUNTER — Telehealth: Payer: Self-pay

## 2020-01-30 VITALS — Ht 70.0 in | Wt 179.6 lb

## 2020-01-30 DIAGNOSIS — I2510 Atherosclerotic heart disease of native coronary artery without angina pectoris: Secondary | ICD-10-CM

## 2020-01-30 DIAGNOSIS — E782 Mixed hyperlipidemia: Secondary | ICD-10-CM

## 2020-01-30 DIAGNOSIS — E119 Type 2 diabetes mellitus without complications: Secondary | ICD-10-CM

## 2020-01-30 NOTE — Telephone Encounter (Signed)
°  Patient Consent for Virtual Visit         Dennis Wilcox has provided verbal consent on 01/30/2020 for a virtual visit (video or telephone).   CONSENT FOR VIRTUAL VISIT FOR:  Dennis Wilcox  By participating in this virtual visit I agree to the following:  I hereby voluntarily request, consent and authorize [facility] and its employed or contracted physicians, physician assistants, nurse practitioners or other licensed health care professionals (the Practitioner), to provide me with telemedicine health care services (the Services") as deemed necessary by the treating Practitioner. I acknowledge and consent to receive the Services by the Practitioner via telemedicine. I understand that the telemedicine visit will involve communicating with the Practitioner through live audiovisual communication technology and the disclosure of certain medical information by electronic transmission. I acknowledge that I have been given the opportunity to request an in-person assessment or other available alternative prior to the telemedicine visit and am voluntarily participating in the telemedicine visit.  I understand that I have the right to withhold or withdraw my consent to the use of telemedicine in the course of my care at any time, without affecting my right to future care or treatment, and that the Practitioner or I may terminate the telemedicine visit at any time. I understand that I have the right to inspect all information obtained and/or recorded in the course of the telemedicine visit and may receive copies of available information for a reasonable fee.  I understand that some of the potential risks of receiving the Services via telemedicine include:   Delay or interruption in medical evaluation due to technological equipment failure or disruption;  Information transmitted may not be sufficient (e.g. poor resolution of images) to allow for appropriate medical decision making by the  Practitioner; and/or   In rare instances, security protocols could fail, causing a breach of personal health information.  Furthermore, I acknowledge that it is my responsibility to provide information about my medical history, conditions and care that is complete and accurate to the best of my ability. I acknowledge that Practitioner's advice, recommendations, and/or decision may be based on factors not within their control, such as incomplete or inaccurate data provided by me or distortions of diagnostic images or specimens that may result from electronic transmissions. I understand that the practice of medicine is not an exact science and that Practitioner makes no warranties or guarantees regarding treatment outcomes. I acknowledge that a copy of this consent can be made available to me via my patient portal [facility] MyChart), or I can request a printed copy by calling the office of [facility].    I understand that my insurance will be billed for this visit.   I have read or had this consent read to me.  I understand the contents of this consent, which adequately explains the benefits and risks of the Services being provided via telemedicine.   I have been provided ample opportunity to ask questions regarding this consent and the Services and have had my questions answered to my satisfaction.  I give my informed consent for the services to be provided through the use of telemedicine in my medical care

## 2020-01-30 NOTE — Telephone Encounter (Signed)
Attempted to call patient to get him prepared for his virtual video visit with Azalee Course, PA-C. Left a voice message asking for the patient to give me a call back at the office to go over his medications and obtain his vitals if possible for his video visit.

## 2020-01-30 NOTE — Progress Notes (Signed)
Virtual Visit via Telephone Note   This visit type was conducted due to national recommendations for restrictions regarding the COVID-19 Pandemic (e.g. social distancing) in an effort to limit this patient's exposure and mitigate transmission in our community.  Due to his co-morbid illnesses, this patient is at least at moderate risk for complications without adequate follow up.  This format is felt to be most appropriate for this patient at this time.  The patient did not have access to video technology/had technical difficulties with video requiring transitioning to audio format only (telephone).  All issues noted in this document were discussed and addressed.  No physical exam could be performed with this format.  Please refer to the patient's chart for his  consent to telehealth for [facility].    Date:  01/30/2020   ID:  Dennis Wilcox, DOB 1965/12/15, MRN [id number] The patient was identified using 2 identifiers.  Patient Location: Home Provider Location: Office/Clinic  PCP:  Mila Palmer, MD  Cardiologist:  Nicki Guadalajara, MD  Electrophysiologist:  None   Evaluation Performed:  Follow-Up Visit  Chief Complaint:  Follow up  History of Present Illness:    Dennis Wilcox is a 54 y.o. male with PMH of CAD, anxiety, hyperlipidemia and DM II.  He had acute MI in 2012 and underwent successful PCI to occluded RCA.  At the time he also had 60% mid LAD and 80% distal LAD lesion, 50% AV groove lesion, 50% distal left circumflex lesion.  Last Myoview obtained in October 2019 showed EF 62%, normal perfusion.  He was last seen virtually by Dr. Tresa Endo in April 2021.  Patient presents today for virtual visit.  He denies any recent chest pain or shortness of breath with a regular activity.  He does have increased shortness of breath with more strenuous activity.  This is unchanged.  I am unable to see the last lipid panel, we will request from the PCPs office.  I will need to see the  lipid panel in order to see if we need to adjust his Crestor dosing.  The patient does not have symptoms concerning for COVID-19 infection (fever, chills, cough, or new shortness of breath).    Past Medical History:  Diagnosis Date  . Hyperlipemia 08/06/2010   [facility] completed ;repeated Feb 2013  . Myocardial infarction (HCC) 06/30/2010   infer wall MI, cath 3 vessel CAD   Past Surgical History:  Procedure Laterality Date  . CARDIAC CATHETERIZATION  06/30/2010   Emergent  . CORONARY ANGIOPLASTY WITH STENT PLACEMENT  06/30/2010   DES of RCA;LAD 60% prox,60%mid, 80% distal; 50% AVgroove stenosis, 50%distal circ stenosis  . R/S myocardial perfusion  08/21/2011   EF72%      Current Meds  Medication Sig  . aspirin EC 81 MG tablet Take 81 mg by mouth daily.  . isosorbide mononitrate (IMDUR) 30 MG 24 hr tablet Take 30 mg by mouth daily.   Marland Kitchen JARDIANCE 10 MG TABS tablet Take 10 mg by mouth daily.  Marland Kitchen lisinopril (PRINIVIL,ZESTRIL) 5 MG tablet Take 2.5 mg by mouth daily.   . metFORMIN (GLUCOPHAGE) 1000 MG tablet Take 1,000 mg by mouth 2 (two) times daily with a meal.   . metoprolol tartrate (LOPRESSOR) 25 MG tablet Take 1 tablet (25 mg total) by mouth 2 (two) times daily. NEED APPOINTMENT FOR FUTURE REFILLS.  Marland Kitchen omeprazole (PRILOSEC) 20 MG capsule Take 20 mg by mouth daily.  . rosuvastatin (CRESTOR) 10 MG tablet Take 10 mg by  mouth daily.  Marland Kitchen venlafaxine XR (EFFEXOR-XR) 75 MG 24 hr capsule Take 75 mg by mouth every other day.      Allergies:   Novocain [procaine hcl]   Social History   Tobacco Use  . Smoking status: Former Smoker    Types: Cigarettes    Quit date: 04/07/2010    Years since quitting: 9.8  . Smokeless tobacco: Never Used  Substance Use Topics  . Alcohol use: No  . Drug use: Not on file     Family Hx: The patient's family history is not on file.  ROS:   Please see the history of present illness.     All other systems reviewed and are negative.   Prior CV  studies:   The following studies were reviewed today:  Myoview 01/29/2018  CLinically and electrically negative for ischemia Excellent exercise capacity  Blood pressure demonstrated a hypertensive response to exercise.  Normal perfusion. No ischemia or scar  LVEF 62%  Low risk scan   Labs/Other Tests and Data Reviewed:    EKG:  An ECG dated 04/22/2018 was personally reviewed today and demonstrated:  Normal sinus rhythm without significant ST-T wave changes.  Recent Labs: No results found for requested labs within last 8760 hours.   Recent Lipid Panel Lab Results  Component Value Date/Time   CHOL  07/01/2010 03:25 AM    192        ATP III CLASSIFICATION:  <200     mg/dL   Desirable  951-884  mg/dL   Borderline High  >=166    mg/dL   High          TRIG 063 (H) 07/01/2010 03:25 AM   HDL 30 (L) 07/01/2010 03:25 AM   CHOLHDL 6.4 07/01/2010 03:25 AM   LDLCALC (H) 07/01/2010 03:25 AM    123        Total Cholesterol/HDL:CHD Risk Coronary Heart Disease Risk Table                     Men   Women  1/2 Average Risk   3.4   3.3  Average Risk       5.0   4.4  2 X Average Risk   9.6   7.1  3 X Average Risk  23.4   11.0        Use the calculated Patient Ratio above and the CHD Risk Table to determine the patient's CHD Risk.        ATP III CLASSIFICATION (LDL):  <100     mg/dL   Optimal  016-010  mg/dL   Near or Above                    Optimal  130-159  mg/dL   Borderline  932-355  mg/dL   High  >732     mg/dL   Very High    Wt Readings from Last 3 Encounters:  01/30/20 179 lb 9.6 oz (81.5 kg)  07/29/19 175 lb (79.4 kg)  04/22/18 186 lb (84.4 kg)     Risk Assessment/Calculations:      Objective:    Vital Signs:  Ht 5\' 10"  (1.778 m)   Wt 179 lb 9.6 oz (81.5 kg)   BMI 25.77 kg/m    VITAL SIGNS:  reviewed  ASSESSMENT & PLAN:    1. CAD: Denies any recent chest pain.  On aspirin, Imdur and Crestor.  He has shortness of breath with more strenuous activity  but  not with every day activity.  2. Hyperlipidemia: On Crestor  3. DM2: Managed by primary care provider  COVID-19 Education: The signs and symptoms of COVID-19 were discussed with the patient and how to seek care for testing (follow up with PCP or arrange E-visit).  The importance of social distancing was discussed today.  Time:   Today, I have spent 10 minutes with the patient with telehealth technology discussing the above problems.     Medication Adjustments/Labs and Tests Ordered: Current medicines are reviewed at length with the patient today.  Concerns regarding medicines are outlined above.   Tests Ordered: No orders of the defined types were placed in this encounter.   Medication Changes: No orders of the defined types were placed in this encounter.   Follow Up:  In Person in 6 month(s)  Signed, Azalee Course, [place]  01/30/2020 4:23 PM    [facility]

## 2020-01-30 NOTE — Patient Instructions (Signed)
Medication Instructions:  Your physician recommends that you continue on your current medications as directed. Please refer to the Current Medication list given to you today.  *If you need a refill on your cardiac medications before your next appointment, please call your pharmacy*  Lab Work: NONE ordered at this time of appointment   If you have labs (blood work) drawn today and your tests are completely normal, you will receive your results only by: . MyChart Message (if you have MyChart) OR . A paper copy in the mail If you have any lab test that is abnormal or we need to change your treatment, we will call you to review the results.  Testing/Procedures: NONE ordered at this time of appointment   Follow-Up: At [facility], you and your health needs are our priority.  As part of our continuing mission to provide you with exceptional heart care, we have created designated Provider Care Teams.  These Care Teams include your primary Cardiologist (physician) and Advanced Practice Providers (APPs -  Physician Assistants and Nurse Practitioners) who all work together to provide you with the care you need, when you need it.  We recommend signing up for the patient portal called "MyChart".  Sign up information is provided on this After Visit Summary.  MyChart is used to connect with patients for Virtual Visits (Telemedicine).  Patients are able to view lab/test results, encounter notes, upcoming appointments, etc.  Non-urgent messages can be sent to your provider as well.   To learn more about what you can do with MyChart, go to [email or web address].    Your next appointment:   6 month(s)  The format for your next appointment:   In Person  Provider:   Thomas Kelly, MD  Other Instructions   

## 2022-03-28 DIAGNOSIS — R079 Chest pain, unspecified: Secondary | ICD-10-CM | POA: Diagnosis not present

## 2023-07-21 LAB — LAB REPORT - SCANNED: A1c: 7.9

## 2023-09-28 ENCOUNTER — Ambulatory Visit: Payer: Self-pay | Admitting: Cardiovascular Disease

## 2023-10-27 ENCOUNTER — Encounter: Payer: Self-pay | Admitting: Cardiology

## 2023-10-27 ENCOUNTER — Other Ambulatory Visit (HOSPITAL_COMMUNITY): Payer: Self-pay

## 2023-10-27 ENCOUNTER — Ambulatory Visit: Attending: Cardiology | Admitting: Cardiology

## 2023-10-27 VITALS — BP 120/84 | HR 64 | Ht 70.0 in | Wt 164.0 lb

## 2023-10-27 DIAGNOSIS — E782 Mixed hyperlipidemia: Secondary | ICD-10-CM | POA: Diagnosis not present

## 2023-10-27 DIAGNOSIS — F17210 Nicotine dependence, cigarettes, uncomplicated: Secondary | ICD-10-CM | POA: Diagnosis not present

## 2023-10-27 DIAGNOSIS — I10 Essential (primary) hypertension: Secondary | ICD-10-CM | POA: Diagnosis not present

## 2023-10-27 DIAGNOSIS — I2510 Atherosclerotic heart disease of native coronary artery without angina pectoris: Secondary | ICD-10-CM

## 2023-10-27 DIAGNOSIS — E119 Type 2 diabetes mellitus without complications: Secondary | ICD-10-CM | POA: Diagnosis not present

## 2023-10-27 MED ORDER — ROSUVASTATIN CALCIUM 20 MG PO TABS
20.0000 mg | ORAL_TABLET | Freq: Every day | ORAL | 3 refills | Status: DC
  Filled 2023-10-27: qty 90, 90d supply, fill #0

## 2023-10-27 NOTE — Patient Instructions (Signed)
 Medication Instructions:  INCREASE Crestor  to 20 mg daily   *If you need a refill on your cardiac medications before your next appointment, please call your pharmacy*  Lab Work: Lipid panel in 3 months   If you have labs (blood work) drawn today and your tests are completely normal, you will receive your results only by: MyChart Message (if you have MyChart) OR A paper copy in the mail If you have any lab test that is abnormal or we need to change your treatment, we will call you to review the results.  Testing/Procedures: Echo  Your physician has requested that you have an echocardiogram. Echocardiography is a painless test that uses sound waves to create images of your heart. It provides your doctor with information about the size and shape of your heart and how well your heart's chambers and valves are working. This procedure takes approximately one hour. There are no restrictions for this procedure. Please do NOT wear cologne, perfume, aftershave, or lotions (deodorant is allowed). Please arrive 15 minutes prior to your appointment time.  Please note: We ask at that you not bring children with you during ultrasound (echo/ vascular) testing. Due to room size and safety concerns, children are not allowed in the ultrasound rooms during exams. Our front office staff cannot provide observation of children in our lobby area while testing is being conducted. An adult accompanying a patient to their appointment will only be allowed in the ultrasound room at the discretion of the ultrasound technician under special circumstances. We apologize for any inconvenience.  Exercise nuclear stress test   Your physician has requested that you have en exercise stress myoview . For further information please visit [email or web address] Please follow instruction sheet, as given.   Follow-Up: At [facility], you and your health needs are our priority.  As part of our continuing mission to provide  you with exceptional heart care, our providers are all part of one team.  This team includes your primary Cardiologist (physician) and Advanced Practice Providers or APPs (Physician Assistants and Nurse Practitioners) who all work together to provide you with the care you need, when you need it.  Your next appointment:   1 year(s)  Provider:   Newman JINNY Lawrence, MD    We recommend signing up for the patient portal called MyChart.  Sign up information is provided on this After Visit Summary.  MyChart is used to connect with patients for Virtual Visits (Telemedicine).  Patients are able to view lab/test results, encounter notes, upcoming appointments, etc.  Non-urgent messages can be sent to your provider as well.   To learn more about what you can do with MyChart, go to [email or web address].

## 2023-10-27 NOTE — Progress Notes (Signed)
 Cardiology Office Note:  .   Date:  10/27/2023  ID:  Dennis Wilcox, DOB [birth year], MRN [id number] PCP: Verena Mems, MD  [facility] Providers Cardiologist:  Newman Lawrence, MD PCP: Verena Mems, MD  Chief Complaint  Patient presents with   Coronary Artery Disease     Dennis Wilcox is a 58 y.o. male with hypertension, hyperlipidemia, type II DM, CAD, nicotine dependence  Discussed the use of AI scribe software for clinical note transcription with the patient, who gave verbal consent to proceed.  History of Present Illness  Patient was last seen by Dr. Burnard in 2021.  He underwent primary PCI to RCA in the setting of STEMI in 2012.  He has not had any recent cardiac workup.  He stays active with work around the house, but does not do any regular exercise.  He denies any chest pain or shortness of breath symptoms.  Unfortunately, he continues to smoke half pack per day, and is not ready to quit smoking.  He drinks about 3-4 beers daily.  A1c is elevated at 7.9%.  He sees his PCP Dr. Smitty for management of his diabetes.  LDL is elevated at 98, currently on Crestor  10 mg daily.     Vitals:   10/27/23 1449  BP: 120/84  Pulse: 64  SpO2: 97%      Review of Systems  Cardiovascular:  Negative for chest pain, dyspnea on exertion, leg swelling, palpitations and syncope.        Studies Reviewed: SABRA        EKG 10/27/2023: Normal sinus rhythm Left axis deviation Incomplete right bundle branch block Inferior infarct (cited on or before 30-Jun-2010) When compared with ECG of 02-Jul-2010 06:51, Incomplete right bundle branch block is now Present Questionable change in initial forces of Inferior leads T wave inversion no longer evident in Inferior leads   Labs 07/2023: Chol 168, TG 156, HDL 42, LDL 98 HbA1C 7.9% Hb 14.2 Cr NA TSH 3.2   Physical Exam Vitals and nursing note reviewed.  Constitutional:      General: He is not in acute  distress. Neck:     Vascular: No JVD.  Cardiovascular:     Rate and Rhythm: Normal rate and regular rhythm.     Heart sounds: Normal heart sounds. No murmur heard. Pulmonary:     Effort: Pulmonary effort is normal.     Breath sounds: Normal breath sounds. No wheezing or rales.  Musculoskeletal:     Right lower leg: No edema.     Left lower leg: No edema.      VISIT DIAGNOSES:   ICD-10-CM   1. Coronary artery disease involving native coronary artery of native heart without angina pectoris  I25.10 EKG 12-Lead    ECHOCARDIOGRAM COMPLETE    Myocardial Perfusion Imaging    2. Primary hypertension  I10 EKG 12-Lead    3. Mixed hyperlipidemia  E78.2 EKG 12-Lead    Lipid panel    4. Type 2 diabetes mellitus without complication, without long-term current use of insulin (HCC)  E11.9     5. Cigarette nicotine dependence without complication  F17.210        Dennis Wilcox is a 58 y.o. male with hypertension, hyperlipidemia, type II DM, CAD, nicotine dependence Assessment & Plan  CAD: Primary PCI to RCA for STEMI in 2012. No current anginal symptoms, but several risk factors remain. Recommend echocardiogram and exercise nuclear stress test for risk stratification. Continue aspirin 81 mg daily,  also continue metoprolol and Imdur at least for now. LDL 98 on Crestor  10 mg daily.  Discussed dietary modification, especially reducing red meat intake.  Increase Crestor  to 20 mg daily.  Repeat lipid panel in 3 months. A1c 7.9%, goal <7%.  3-4 beers daily likely contributing to uncontrolled diabetes as well.  Strongly recommend reducing alcohol intake.  Discussed with PCP regarding additional medications for aggressive diabetes control.  Nicotine dependence: Tobacco cessation counseling:  - Currently smoking 1/2 packs/day   - Patient was informed of the dangers of tobacco abuse including stroke, cancer, and MI, as well as benefits of tobacco cessation. - Patient is NOT willing to quit  at this time. - Approximately 5 mins were spent counseling patient cessation techniques. We discussed various methods to help quit smoking, including deciding on a date to quit, joining a support group, pharmacological agents. - I will reassess his progress at the next follow-up visit  Hypertension: Well-controlled.  Mixed hyperlipidemia: As above  Type 2 diabetes mellitus: As above     Informed Consent   Shared Decision Making/Informed Consent The risks [chest pain, shortness of breath, cardiac arrhythmias, dizziness, blood pressure fluctuations, myocardial infarction, stroke/transient ischemic attack, nausea, vomiting, allergic reaction, radiation exposure, metallic taste sensation and life-threatening complications (estimated to be 1 in 10,000)], benefits (risk stratification, diagnosing coronary artery disease, treatment guidance) and alternatives of a nuclear stress test were discussed in detail with Dennis Wilcox and he agrees to proceed.       Meds ordered this encounter  Medications   rosuvastatin  (CRESTOR ) 20 MG tablet    Sig: Take 1 tablet (20 mg total) by mouth daily.    Dispense:  90 tablet    Refill:  3     F/u in 1 year  Signed, Newman JINNY Lawrence, MD

## 2023-11-17 ENCOUNTER — Telehealth (HOSPITAL_COMMUNITY): Payer: Self-pay

## 2023-11-17 NOTE — Telephone Encounter (Signed)
 Spoke with the patient, detailed instructions given. S.Sueo Cullen CCT

## 2023-11-23 ENCOUNTER — Other Ambulatory Visit: Payer: Self-pay | Admitting: Cardiology

## 2023-11-23 DIAGNOSIS — I2510 Atherosclerotic heart disease of native coronary artery without angina pectoris: Secondary | ICD-10-CM

## 2023-11-24 ENCOUNTER — Ambulatory Visit (HOSPITAL_COMMUNITY): Admission: RE | Admit: 2023-11-24 | Source: Ambulatory Visit | Attending: Cardiology

## 2023-12-03 ENCOUNTER — Ambulatory Visit (HOSPITAL_COMMUNITY)

## 2023-12-15 ENCOUNTER — Encounter (HOSPITAL_COMMUNITY): Payer: Self-pay | Admitting: *Deleted

## 2023-12-29 ENCOUNTER — Ambulatory Visit (HOSPITAL_COMMUNITY)
Admission: RE | Admit: 2023-12-29 | Discharge: 2023-12-29 | Disposition: A | Discharge status: Home / Self Care | Source: Ambulatory Visit | Attending: Internal Medicine | Admitting: Internal Medicine

## 2023-12-29 ENCOUNTER — Ambulatory Visit (HOSPITAL_BASED_OUTPATIENT_CLINIC_OR_DEPARTMENT_OTHER)
Admission: RE | Admit: 2023-12-29 | Discharge: 2023-12-29 | Disposition: A | Discharge status: Home / Self Care | Source: Ambulatory Visit | Attending: Internal Medicine | Admitting: Internal Medicine

## 2023-12-29 ENCOUNTER — Ambulatory Visit: Payer: Self-pay | Admitting: Cardiology

## 2023-12-29 DIAGNOSIS — I2510 Atherosclerotic heart disease of native coronary artery without angina pectoris: Secondary | ICD-10-CM

## 2023-12-29 DIAGNOSIS — E782 Mixed hyperlipidemia: Secondary | ICD-10-CM

## 2023-12-29 LAB — MYOCARDIAL PERFUSION IMAGING
Angina Index: 0
Duke Treadmill Score: 12
Estimated workload: 13.4
Exercise duration (min): 12 min
LV dias vol: 82 mL (ref 62–150)
LV sys vol: 30 mL (ref 4.2–5.8)
MPHR: 162 {beats}/min
Nuc Stress EF: 63 %
Peak HR: 141 {beats}/min
Percent HR: 87 %
RPE: 19
Rest HR: 58 {beats}/min
Rest Nuclear Isotope Dose: 10.8 mCi
SDS: 6
SRS: 0
SSS: 6
ST Depression (mm): 0 mm
Stress Nuclear Isotope Dose: 32.8 mCi
TID: 1

## 2023-12-29 LAB — ECHOCARDIOGRAM COMPLETE
Area-P 1/2: 4.49 cm2
S' Lateral: 3.2 cm

## 2023-12-29 MED ORDER — TECHNETIUM TC 99M TETROFOSMIN IV KIT
32.8000 | PACK | Freq: Once | INTRAVENOUS | Status: AC | PRN
  Administered 2023-12-29: 32.8 via INTRAVENOUS

## 2023-12-29 MED ORDER — TECHNETIUM TC 99M TETROFOSMIN IV KIT
10.8000 | PACK | Freq: Once | INTRAVENOUS | Status: AC | PRN
  Administered 2023-12-29: 10.8 via INTRAVENOUS

## 2023-12-29 NOTE — Progress Notes (Signed)
 Normal pumping function of the heart. No severe heart valve abnormalities noted.  Please see separate note about stress test results.  Thanks MJP

## 2023-12-29 NOTE — Progress Notes (Signed)
 Multivessel coronary calcification with mild ischemia. No current angina symptoms. Recommend continued medical management and follow up in [id number] with me. Let us  check lipid panel before that visit for mixed hyperlipidemia.  Thanks MJP

## 2023-12-30 NOTE — Progress Notes (Signed)
 In absence of chest pain, I would continue medical management.  Will discuss more during office visit in a few weeks as scheduled.  Thanks MJP

## 2024-01-01 NOTE — Progress Notes (Signed)
 Pt contacted and advised. Pt agrees with plan of care.

## 2024-03-07 ENCOUNTER — Ambulatory Visit: Attending: Cardiology | Admitting: Cardiology

## 2024-03-07 ENCOUNTER — Encounter: Payer: Self-pay | Admitting: Cardiology

## 2024-03-07 VITALS — BP 110/60 | HR 62 | Ht 72.0 in | Wt 171.0 lb

## 2024-03-07 DIAGNOSIS — F17210 Nicotine dependence, cigarettes, uncomplicated: Secondary | ICD-10-CM

## 2024-03-07 DIAGNOSIS — I25118 Atherosclerotic heart disease of native coronary artery with other forms of angina pectoris: Secondary | ICD-10-CM | POA: Diagnosis not present

## 2024-03-07 DIAGNOSIS — E119 Type 2 diabetes mellitus without complications: Secondary | ICD-10-CM | POA: Diagnosis not present

## 2024-03-07 DIAGNOSIS — E782 Mixed hyperlipidemia: Secondary | ICD-10-CM

## 2024-03-07 DIAGNOSIS — I10 Essential (primary) hypertension: Secondary | ICD-10-CM

## 2024-03-07 NOTE — Patient Instructions (Addendum)
 Lab Work: CBC BMP  If you have labs (blood work) drawn today and your tests are completely normal, you will receive your results only by: MyChart Message (if you have MyChart) OR A paper copy in the mail If you have any lab test that is abnormal or we need to change your treatment, we will call you to review the results.  Testing/Procedures: LEFT HEART CATH  Your physician has requested that you have a cardiac catheterization. Cardiac catheterization is used to diagnose and/or treat various heart conditions. Doctors may recommend this procedure for a number of different reasons. The most common reason is to evaluate chest pain. Chest pain can be a symptom of coronary artery disease (CAD), and cardiac catheterization can show whether plaque is narrowing or blocking your heart's arteries. This procedure is also used to evaluate the valves, as well as measure the blood flow and oxygen levels in different parts of your heart. For further information please visit [email or web address] Please follow instruction sheet, as given.   Follow-Up: At [facility], you and your health needs are our priority.  As part of our continuing mission to provide you with exceptional heart care, our providers are all part of one team.  This team includes your primary Cardiologist (physician) and Advanced Practice Providers or APPs (Physician Assistants and Nurse Practitioners) who all work together to provide you with the care you need, when you need it.  Your next appointment:   2 week(s) POST CATH   Provider:   One of our Advanced Practice Providers (APPs): Morse Clause, PA-C  Lamarr Satterfield, NP Miriam Shams, NP  Olivia Pavy, PA-C Josefa Beauvais, NP  Leontine Salen, PA-C Orren Fabry, PA-C  Hao Meng, PA-C Ernest Dick, NP  Damien Braver, NP Jon Hails, PA-C  Waddell Donath, PA-C    Dayna Dunn, PA-C  Scott Weaver, PA-C Lum Louis, NP Katlyn West, NP Callie Goodrich, PA-C  Xika Zhao,  NP Sheng Haley, PA-C    Kathleen Shimada, PA-C   We recommend signing up for the patient portal called MyChart.  Sign up information is provided on this After Visit Summary.  MyChart is used to connect with patients for Virtual Visits (Telemedicine).  Patients are able to view lab/test results, encounter notes, upcoming appointments, etc.  Non-urgent messages can be sent to your provider as well.   To learn more about what you can do with MyChart, go to [email or web address].   Other Instructions  RAZI HICKLE  03/07/2024  You are scheduled for a Cardiac Catheterization on Tuesday, December 16 with Dr. Newman Lawrence.  1. Please arrive at the [facility] (Main Entrance A) at [facility]: [address] [place], [place] [address] at 7:00 AM (This time is 2 hour(s) before your procedure to ensure your preparation).   Free valet parking service is available. You will check in at ADMITTING. The support person will be asked to wait in the waiting room.  It is OK to have someone drop you off and come back when you are ready to be discharged.    Special note: Every effort is made to have your procedure done on time. Please understand that emergencies sometimes delay scheduled procedures.  2. Diet: Nothing to eat after midnight.   3. Hydration: You need to be well hydrated before your procedure. On December 16, you may drink approved liquids (see below) until 2 hours before the procedure, with 16 oz of water as your last intake.   List of approved liquids  water, clear juice, clear tea, black coffee, fruit juices, non-citric and without pulp, carbonated beverages, Gatorade, Kool -Aid, plain Jello-O and plain ice popsicles.  4. Labs: labs today  5. Medication instructions in preparation for your procedure:   Contrast Allergy: No  On the morning of your procedure, take your Aspirin 81 mg and any morning medicines NOT listed above.  You may use sips of water.  6. Plan to go  home the same day, you will only stay overnight if medically necessary. 7. Bring a current list of your medications and current insurance cards. 8. You MUST have a responsible person to drive you home. 9. Someone MUST be with you the first 24 hours after you arrive home or your discharge will be delayed. 10. Please wear clothes that are easy to get on and off and wear slip-on shoes.  Thank you for allowing us  to care for you!   -- [facility]

## 2024-03-07 NOTE — Progress Notes (Signed)
 Cardiology Office Note:  .   Date:  03/07/2024  ID:  Dennis Wilcox, DOB [birth year], MRN [id number] PCP: Verena Mems, MD  [facility] Providers Cardiologist:  Newman Lawrence, MD PCP: Verena Mems, MD  Chief Complaint  Patient presents with   Coronary Artery Disease     Dennis Wilcox is a 58 y.o. male with hypertension, hyperlipidemia, type II DM, CAD, nicotine dependence  Discussed the use of AI scribe software for clinical note transcription with the patient, who gave verbal consent to proceed.  History of Present Illness  Patient does not have typical chest pain symptoms, but gets out of breath easily.  He stays busy with good work etc., and is not able to do as much as he could do before.  The symptoms are similar to what he experienced before his MI in 2012.  Reviewed recent stress test results with the patient, details below.    Vitals:   03/07/24 1353  BP: 110/60  Pulse: 62  SpO2: 97%       Review of Systems  Constitutional: Positive for malaise/fatigue.  Cardiovascular:  Positive for dyspnea on exertion. Negative for chest pain, leg swelling, palpitations and syncope.        Studies Reviewed: SABRA        EKG 03/07/2024: Normal sinus rhythm Normal ECG When compared with ECG of 27-Oct-2023 14:54, QRS axis Shifted right Criteria for Inferior infarct are no longer Present   Echocardiogram 12/2023: 1. Left ventricular ejection fraction, by estimation, is 60 to 65%. Left  ventricular ejection fraction by 3D volume is 65 %. The left ventricle has  normal function. The left ventricle has no regional wall motion  abnormalities. There is mild left  ventricular hypertrophy. Left ventricular diastolic parameters were  normal. The average left ventricular global longitudinal strain is -20.7  %. The global longitudinal strain is normal.   2. Right ventricular systolic function is normal. The right ventricular  size is normal. Tricuspid  regurgitation signal is inadequate for assessing  PA pressure.   3. The mitral valve is normal in structure. Trivial mitral valve  regurgitation. No evidence of mitral stenosis.   4. The aortic valve is tricuspid. Aortic valve regurgitation is not  visualized. Aortic valve sclerosis/calcification is present, without any  evidence of aortic stenosis.   5. The inferior vena cava is normal in size with greater than 50%  respiratory variability, suggesting right atrial pressure of 3 mmHg.   Stress test 12/2023:   Findings are consistent with ischemia. The study is low risk.   No diagnostic ST deviation was noted. Nondiagnostic ST depression in the inferolateral leads noted in recovery.   LV perfusion is abnormal. There is evidence of ischemia. There is no evidence of infarction. Defect 1: There is a small defect with mild reduction in uptake present in the apical inferior location(s) that is reversible. There is normal wall motion in the defect area. Consistent with ischemia.   Nuclear stress EF: 63%. The left ventricular ejection fraction is normal (55-65%). End diastolic cavity size is normal. End systolic cavity size is normal.   CT images were obtained for attenuation correction and were examined for the presence of coronary calcium  when appropriate.   Coronary calcium  was present on the attenuation correction CT images. Severe coronary calcifications were present. Coronary calcifications were present in the left anterior descending artery, left circumflex artery and right coronary artery distribution(s).   Prior study available for comparison from 01/29/2018.   Abnormal  stress nuclear study with mild ischemia in the distal inferior wall/apex; ejection fraction 63% with normal wall motion; severe calcification noted in all 3 coronary distributions; overall felt to be a low risk study.  Labs 07/2023: Chol 168, TG 156, HDL 42, LDL 98 HbA1C 7.9% Hb 14.2 Cr NA TSH 3.2   Physical Exam Vitals  and nursing note reviewed.  Constitutional:      General: He is not in acute distress. Neck:     Vascular: No JVD.  Cardiovascular:     Rate and Rhythm: Normal rate and regular rhythm.     Heart sounds: Normal heart sounds. No murmur heard. Pulmonary:     Effort: Pulmonary effort is normal.     Breath sounds: Normal breath sounds. No wheezing or rales.  Musculoskeletal:     Right lower leg: No edema.     Left lower leg: No edema.      VISIT DIAGNOSES:   ICD-10-CM   1. Coronary artery disease involving native coronary artery of native heart with other form of angina pectoris  I25.118 EKG 12-Lead    CBC    Basic metabolic panel with GFR    2. Mixed hyperlipidemia  E78.2 EKG 12-Lead    3. Primary hypertension  I10 EKG 12-Lead    4. Type 2 diabetes mellitus without complication, without long-term current use of insulin (HCC)  E11.9     5. Cigarette nicotine dependence without complication  F17.210         Dennis Wilcox is a 58 y.o. male with hypertension, hyperlipidemia, type II DM, CAD, nicotine dependence Assessment & Plan  CAD: Primary PCI to RCA for STEMI in 2012. Severe multivessel calcification, with mild inferoapical ischemia on stress test 01/2024. I suspect he is having angina equivalent symptoms with exertional dyspnea and getting fatigued while doing physical activity.  Symptoms are similar to what he experienced before his MI in 2012. He is on optimal medical therapy with aspirin, statin, Toprol tartrate, Imdur, lisinopril, with no improvement in symptoms. Recommend coronary angiography and possible intervention.  Nicotine dependence: Recommend smoking cessation, but patient is not ready to quit at this time.  Hypertension: Well-controlled.  Mixed hyperlipidemia: As above  Type 2 diabetes mellitus: Management as per PCP, goal A1c <7%.   Informed Consent   Shared Decision Making/Informed Consent The risks [stroke (1 in 1000), death (1 in 1000),  kidney failure [usually temporary] (1 in 500), bleeding (1 in 200), allergic reaction [possibly serious] (1 in 200)], benefits (diagnostic support and management of coronary artery disease) and alternatives of a cardiac catheterization were discussed in detail with Dennis Wilcox and he is willing to proceed.      F/u in 1 year  Signed, Newman JINNY Lawrence, MD

## 2024-03-08 LAB — CBC
Hematocrit: 42.5 % (ref 37.5–51.0)
Hemoglobin: 14.1 g/dL (ref 13.0–17.7)
MCH: 32.3 pg (ref 26.6–33.0)
MCHC: 33.2 g/dL (ref 31.5–35.7)
MCV: 98 fL — ABNORMAL HIGH (ref 79–97)
Platelets: 343 x10E3/uL (ref 150–450)
RBC: 4.36 x10E6/uL (ref 4.14–5.80)
RDW: 12.9 % (ref 11.6–15.4)
WBC: 8.3 x10E3/uL (ref 3.4–10.8)

## 2024-03-08 LAB — BASIC METABOLIC PANEL WITH GFR
BUN/Creatinine Ratio: 8 — AB (ref 9–20)
BUN: 8 mg/dL (ref 6–24)
CO2: 22 mmol/L (ref 20–29)
Calcium: 9 mg/dL (ref 8.7–10.2)
Chloride: 100 mmol/L (ref 96–106)
Creatinine, Ser: 0.96 mg/dL (ref 0.76–1.27)
Glucose: 110 mg/dL — AB (ref 70–99)
Potassium: 4.1 mmol/L (ref 3.5–5.2)
Sodium: 139 mmol/L (ref 134–144)
eGFR: 92 mL/min/1.73 (ref >59)

## 2024-03-21 ENCOUNTER — Telehealth: Payer: Self-pay | Admitting: *Deleted

## 2024-03-21 NOTE — Telephone Encounter (Signed)
 Cardiac Catheterization scheduled at [facility] for: Tuesday March 22, 2024 9 AM Arrival time [facility] Main Entrance A at: 7 AM  Diet: -Nothing to eat after midnight.  Hydration: -May drink clear liquids until 2 hours before the procedure.  Approved liquids: Water , clear tea, black coffee, fruit juices-non-citric and without pulp,Gatorade, plain Jello/popsicles.   -Please drink 16 oz of water  2 hours before procedure.  Medication instructions: -Hold:  Metformin-day of procedure and 48 hours after procedure  Jardiance-AM of procedure  -Other usual morning medications can be taken including aspirin  81 mg.  Plan to go home the same day, you will only stay overnight if medically necessary.  You must have responsible adult to drive you home.  Someone must be with you the first 24 hours after you arrive home.  Reviewed procedure instructions with patient.

## 2024-03-22 ENCOUNTER — Encounter (HOSPITAL_COMMUNITY): Admission: RE | Disposition: A | Payer: Self-pay | Attending: Cardiology

## 2024-03-22 ENCOUNTER — Encounter (HOSPITAL_COMMUNITY): Payer: Self-pay | Admitting: Cardiology

## 2024-03-22 ENCOUNTER — Ambulatory Visit (HOSPITAL_COMMUNITY)
Admission: RE | Admit: 2024-03-22 | Discharge: 2024-03-22 | Disposition: A | Attending: Cardiology | Admitting: Cardiology

## 2024-03-22 ENCOUNTER — Other Ambulatory Visit (HOSPITAL_COMMUNITY): Payer: Self-pay

## 2024-03-22 ENCOUNTER — Other Ambulatory Visit: Payer: Self-pay

## 2024-03-22 DIAGNOSIS — I2584 Coronary atherosclerosis due to calcified coronary lesion: Secondary | ICD-10-CM | POA: Diagnosis not present

## 2024-03-22 DIAGNOSIS — F17200 Nicotine dependence, unspecified, uncomplicated: Secondary | ICD-10-CM | POA: Diagnosis not present

## 2024-03-22 DIAGNOSIS — Z79899 Other long term (current) drug therapy: Secondary | ICD-10-CM | POA: Diagnosis not present

## 2024-03-22 DIAGNOSIS — E782 Mixed hyperlipidemia: Secondary | ICD-10-CM | POA: Diagnosis present

## 2024-03-22 DIAGNOSIS — I2510 Atherosclerotic heart disease of native coronary artery without angina pectoris: Secondary | ICD-10-CM | POA: Diagnosis present

## 2024-03-22 DIAGNOSIS — E119 Type 2 diabetes mellitus without complications: Secondary | ICD-10-CM | POA: Diagnosis not present

## 2024-03-22 DIAGNOSIS — I25118 Atherosclerotic heart disease of native coronary artery with other forms of angina pectoris: Secondary | ICD-10-CM | POA: Diagnosis present

## 2024-03-22 DIAGNOSIS — I10 Essential (primary) hypertension: Secondary | ICD-10-CM | POA: Diagnosis not present

## 2024-03-22 DIAGNOSIS — Z7984 Long term (current) use of oral hypoglycemic drugs: Secondary | ICD-10-CM | POA: Diagnosis not present

## 2024-03-22 DIAGNOSIS — I252 Old myocardial infarction: Secondary | ICD-10-CM | POA: Diagnosis not present

## 2024-03-22 DIAGNOSIS — Z7982 Long term (current) use of aspirin: Secondary | ICD-10-CM | POA: Diagnosis not present

## 2024-03-22 DIAGNOSIS — Z955 Presence of coronary angioplasty implant and graft: Secondary | ICD-10-CM | POA: Diagnosis not present

## 2024-03-22 DIAGNOSIS — Z7902 Long term (current) use of antithrombotics/antiplatelets: Secondary | ICD-10-CM | POA: Diagnosis not present

## 2024-03-22 DIAGNOSIS — Z006 Encounter for examination for normal comparison and control in clinical research program: Secondary | ICD-10-CM

## 2024-03-22 HISTORY — PX: LEFT HEART CATH AND CORONARY ANGIOGRAPHY: CATH118249

## 2024-03-22 HISTORY — PX: CORONARY STENT INTERVENTION: CATH118234

## 2024-03-22 HISTORY — PX: CORONARY BALLOON ANGIOPLASTY: CATH118233

## 2024-03-22 LAB — GLUCOSE, CAPILLARY
Glucose-Capillary: 158 mg/dL — ABNORMAL HIGH (ref 70–99)
Glucose-Capillary: 152 mg/dL — ABNORMAL HIGH (ref 70–99)

## 2024-03-22 LAB — BASIC METABOLIC PANEL WITH GFR
Anion gap: 11 (ref 5–15)
BUN: 8 mg/dL (ref 6–20)
CO2: 27 mmol/L (ref 22–32)
Calcium: 8.8 mg/dL — ABNORMAL LOW (ref 8.9–10.3)
Chloride: 99 mmol/L (ref 98–111)
Creatinine, Ser: 0.93 mg/dL (ref 0.61–1.24)
GFR, Estimated: >60 mL/min (ref >60)
Glucose, Bld: 160 mg/dL — ABNORMAL HIGH (ref 70–99)
Potassium: 3.8 mmol/L (ref 3.5–5.1)
Sodium: 137 mmol/L (ref 135–145)

## 2024-03-22 SURGERY — LEFT HEART CATH AND CORONARY ANGIOGRAPHY
Anesthesia: LOCAL

## 2024-03-22 MED ORDER — FENTANYL CITRATE (PF) 100 MCG/2ML IJ SOLN
INTRAMUSCULAR | Status: DC | PRN
  Administered 2024-03-22: 09:00:00 25 ug via INTRAVENOUS

## 2024-03-22 MED ORDER — CLOPIDOGREL BISULFATE 75 MG PO TABS: 75.0000 mg | ORAL_TABLET | Freq: Every day | ORAL | 2 refills | Status: DC

## 2024-03-22 MED ORDER — SODIUM CHLORIDE 0.9 % IV SOLN: 250.0000 mL | INTRAVENOUS | Status: DC | PRN

## 2024-03-22 MED ORDER — CLOPIDOGREL BISULFATE 300 MG PO TABS
ORAL_TABLET | ORAL | Status: AC
  Filled 2024-03-22: qty 2

## 2024-03-22 MED ORDER — CLOPIDOGREL BISULFATE 75 MG PO TABS
75.0000 mg | ORAL_TABLET | Freq: Every day | ORAL | 2 refills | Status: AC
  Filled 2024-03-22: qty 90, 90d supply, fill #0

## 2024-03-22 MED ORDER — LIDOCAINE HCL (PF) 1 % IJ SOLN
INTRAMUSCULAR | Status: AC
  Filled 2024-03-22: qty 30

## 2024-03-22 MED ORDER — FENTANYL CITRATE (PF) 100 MCG/2ML IJ SOLN
INTRAMUSCULAR | Status: AC
  Filled 2024-03-22: qty 2

## 2024-03-22 MED ORDER — PANTOPRAZOLE SODIUM 20 MG PO TBEC: 20.0000 mg | DELAYED_RELEASE_TABLET | Freq: Every day | ORAL | 2 refills | Status: DC

## 2024-03-22 MED ORDER — ASPIRIN 81 MG PO CHEW: 81.0000 mg | CHEWABLE_TABLET | ORAL | Status: DC

## 2024-03-22 MED ORDER — NITROGLYCERIN 1 MG/10 ML FOR IR/CATH LAB
INTRA_ARTERIAL | Status: AC
  Filled 2024-03-22: qty 10

## 2024-03-22 MED ORDER — SODIUM CHLORIDE 0.9 % IV SOLN
INTRAVENOUS | Status: AC | PRN
  Administered 2024-03-22: 10:00:00 250 mL via INTRAVENOUS

## 2024-03-22 MED ORDER — CLOPIDOGREL BISULFATE 300 MG PO TABS
ORAL_TABLET | ORAL | Status: DC | PRN
  Administered 2024-03-22: 09:00:00 600 mg via ORAL

## 2024-03-22 MED ORDER — MIDAZOLAM HCL 2 MG/2ML IJ SOLN
INTRAMUSCULAR | Status: AC
  Filled 2024-03-22: qty 2

## 2024-03-22 MED ORDER — SODIUM CHLORIDE 0.9% FLUSH: 3.0000 mL | Freq: Two times a day (BID) | INTRAVENOUS | Status: DC

## 2024-03-22 MED ORDER — ONDANSETRON HCL 4 MG/2ML IJ SOLN: 4.0000 mg | Freq: Four times a day (QID) | INTRAMUSCULAR | Status: DC | PRN

## 2024-03-22 MED ORDER — LABETALOL HCL 5 MG/ML IV SOLN: 10.0000 mg | INTRAVENOUS | Status: DC | PRN

## 2024-03-22 MED ORDER — HEPARIN SODIUM (PORCINE) 1000 UNIT/ML IJ SOLN
INTRAMUSCULAR | Status: AC
  Filled 2024-03-22: qty 10

## 2024-03-22 MED ORDER — FREE WATER: 500.0000 mL | Freq: Once | Status: DC

## 2024-03-22 MED ORDER — IOHEXOL 350 MG/ML SOLN
INTRAVENOUS | Status: DC | PRN
  Administered 2024-03-22: 10:00:00 90 mL

## 2024-03-22 MED ORDER — PANTOPRAZOLE SODIUM 20 MG PO TBEC
20.0000 mg | DELAYED_RELEASE_TABLET | Freq: Every day | ORAL | 3 refills | Status: AC
  Filled 2024-03-22: qty 30, 30d supply, fill #0

## 2024-03-22 MED ORDER — SODIUM CHLORIDE 0.9% FLUSH: 3.0000 mL | INTRAVENOUS | Status: DC | PRN

## 2024-03-22 MED ORDER — MIDAZOLAM HCL (PF) 2 MG/2ML IJ SOLN
INTRAMUSCULAR | Status: DC | PRN
  Administered 2024-03-22: 09:00:00 1 mg via INTRAVENOUS

## 2024-03-22 MED ORDER — VERAPAMIL HCL 2.5 MG/ML IV SOLN
INTRAVENOUS | Status: AC
  Filled 2024-03-22: qty 2

## 2024-03-22 MED ORDER — HEPARIN (PORCINE) IN NACL 1000-0.9 UT/500ML-% IV SOLN
INTRAVENOUS | Status: DC | PRN
  Administered 2024-03-22 (×2): 500 mL

## 2024-03-22 MED ORDER — ACETAMINOPHEN 325 MG PO TABS
650.0000 mg | ORAL_TABLET | ORAL | Status: DC | PRN
  Administered 2024-03-22: 11:00:00 650 mg via ORAL
  Filled 2024-03-22: qty 2

## 2024-03-22 MED ORDER — ANGIOPLASTY BOOK
Status: AC
  Filled 2024-03-22: qty 1

## 2024-03-22 MED ORDER — TICAGRELOR 90 MG PO TABS
ORAL_TABLET | ORAL | Status: AC
  Filled 2024-03-22: qty 2

## 2024-03-22 MED ORDER — HEPARIN SODIUM (PORCINE) 1000 UNIT/ML IJ SOLN
INTRAMUSCULAR | Status: DC | PRN
  Administered 2024-03-22 (×3): 4000 [IU] via INTRAVENOUS
  Administered 2024-03-22: 10:00:00 3000 [IU] via INTRAVENOUS

## 2024-03-22 MED ORDER — HYDRALAZINE HCL 20 MG/ML IJ SOLN: 10.0000 mg | INTRAMUSCULAR | Status: DC | PRN

## 2024-03-22 MED ORDER — LIDOCAINE HCL (PF) 1 % IJ SOLN
INTRAMUSCULAR | Status: DC | PRN
  Administered 2024-03-22: 09:00:00 2 mL

## 2024-03-22 MED ORDER — VERAPAMIL HCL 2.5 MG/ML IV SOLN
INTRAVENOUS | Status: DC | PRN
  Administered 2024-03-22: 09:00:00 10 mL via INTRA_ARTERIAL

## 2024-03-22 MED ORDER — NITROGLYCERIN 1 MG/10 ML FOR IR/CATH LAB
INTRA_ARTERIAL | Status: DC | PRN
  Administered 2024-03-22: 10:00:00 200 ug via INTRACORONARY
  Administered 2024-03-22: 10:00:00 100 ug via INTRACORONARY

## 2024-03-22 MED ORDER — CLOPIDOGREL BISULFATE 75 MG PO TABS: 75.0000 mg | ORAL_TABLET | Freq: Every day | ORAL | Status: DC

## 2024-03-22 SURGICAL SUPPLY — 19 items
BALLN PREVAIL DCB 3.00X20 (BALLOONS) IMPLANT
BALLOON SAPPHIRE NC24 2.50X8 (BALLOONS) IMPLANT
BALLOON SAPPHIRE NC24 3.0X8 (BALLOONS) IMPLANT
BALLOON SCOREFLEX 3.0X10 (BALLOONS) IMPLANT
CATH INFINITI AMBI 5FR TG (CATHETERS) IMPLANT
CATH IV OPTICROSS HD 3FRX135 (CATHETERS) IMPLANT
CATH VISTA GUIDE 6FR JR4 ECOPK (CATHETERS) IMPLANT
DEVICE RAD COMP TR BAND LRG (VASCULAR PRODUCTS) IMPLANT
DRAPE IVUS SLED (BAG) IMPLANT
GLIDESHEATH SLEND SS 6F .021 (SHEATH) IMPLANT
GUIDEWIRE INQWIRE 1.5J.035X260 (WIRE) IMPLANT
KIT ENCORE 26 ADVANTAGE (KITS) IMPLANT
KIT HEMO VALVE WATCHDOG (MISCELLANEOUS) IMPLANT
KIT NAMIC PS PRESSURIZED FLUID (KITS) IMPLANT
KIT SINGLE USE MANIFOLD (KITS) IMPLANT
PACK CARDIAC CATHETERIZATION (CUSTOM PROCEDURE TRAY) ×1 IMPLANT
SET ATX-X65L (MISCELLANEOUS) IMPLANT
STENT SYNERGY XD 3.0X16 (Permanent Stent) IMPLANT
WIRE RUNTHROUGH .014X180CM (WIRE) IMPLANT

## 2024-03-22 NOTE — Research (Signed)
 Prevail Informed Consent   Subject Name: Dennis Wilcox  Subject met inclusion and exclusion criteria.  The informed consent form, study requirements and expectations were reviewed with the subject and questions and concerns were addressed prior to the signing of the consent form.  The subject verbalized understanding of the trial requirements.  The subject agreed to participate in the Prevail trial and signed the informed consent at 0705 on 03/22/2024.  The informed consent was obtained prior to performance of any protocol-specific procedures for the subject.  A copy of the signed informed consent was given to the subject and a copy was placed in the subject's medical record.   [name]

## 2024-03-22 NOTE — Progress Notes (Signed)
 CARDIAC REHAB PHASE 1  Stent education completed with patient and significant other including restrictions, antiplatelet use, CP, NTG use, and calling 911, risk factor modification, and activity progression. Heart healthy/ diabetic diet and exercise handouts given. Discussed tobacco cessation, and 1880QUITNOW handout given. Patient verbalizes understanding of information given. Discussed phase 2 cardiac rehab with him, but he is not interested in participation at this time due to having a lot to do. Advised him he can participate in the program in the future if he changes his mind.  Arnoldo CHRISTELLA Gal, MS, ACSM SHIELA 03/22/2024 [phone]

## 2024-03-22 NOTE — Progress Notes (Signed)
 Discharge instructions reviewed with patient and wife Rosie at the bedside. Denies questions or concerns. PT ambulated in the hallway. Was able to void without difficulty. PT tolerated PO intake no complaints of n/v. TR Band removed. No s/s of complications at the incision site. PT escorted from the unit via wheel chair to personal vehicle.

## 2024-03-22 NOTE — Discharge Summary (Signed)
 Discharge Summary for Same Day PCI   Patient ID: Dennis Wilcox MRN: [id number]; DOB: 10-29-1965  Admit date: 03/22/2024 Discharge date: 03/22/2024  Primary Care Provider: Verena Mems, MD  Primary Cardiologist: Newman JINNY Lawrence, MD  Primary Electrophysiologist:  None   Discharge Diagnoses    Active Problems:   CAD (coronary artery disease)   Mixed hyperlipidemia   HTN (hypertension)   S/P drug eluting coronary stent placement  Diagnostic Studies/Procedures    Cardiac Catheterization 03/22/2024:    LV end diastolic pressure is normal.   Coronary angiography & intervention 03/22/2024: LM: No significant disease LAD: Proximal/mid calcified 30% disease          Mid to distal LAD tandem 80% stenoses, followed by 60% disease Lcx: Proximal 50% disease, distal focal 80% stenosis RCA: Dominant vessel, proximal/mid 20% disease           Distal RCA focal 90% ISR, RPDA 70% disease   LVEDP 13 mmHg   Successful percutaneous coronary intervention dRCA        Drug coated balloon angioplasty 3.0 X 20 mm (per PREVAIL trial) dRCA        Overlapping stent 3.0 X 16 mm Synergy drug-eluting stent, deployed at 16 atm (stent placement necessitated by focal dissection)        0% residual stenosis at the lesion, TIMI-3 flow      We will consider symptom guided treatment of mid to distal LAD, potentially with drug-coated balloon as part of peripheral clinical trial, after 30 days from today.   _____________   History of Present Illness     Dennis Wilcox is a 58 y.o. male with hypertension, hyperlipidemia, type 2 diabetes, nicotine dependence, coronary artery disease s/p PCI to RCA for STEMI in 2012.  He was seen by Dr. Lawrence 03/07/2024 as an outpatient.  He had undergone a stress test October 2025 that showed mild inferoapical ischemia.  It was suspected that he was having anginal equivalent symptoms with exertional dyspnea, fatigue with physical activity.  Cardiac  catheterization was arranged for further evaluation.  Hospital Course     The patient underwent cardiac cath as noted above with Dr. Lawrence. Plan for DAPT with ASA/Plavix  for at least 6 months. The patient was seen by cardiac rehab while in short stay. There were no observed complications post cath. Radial cath site was re-evaluated prior to discharge and found to be stable without any complications. Instructions/precautions regarding cath site care were given prior to discharge. Discussed that as long as nothing changes before he is set for discharge, he is ready to go.   Dennis Wilcox was seen by Dr. Flint and determined stable for discharge home. Follow up with our office has been arranged. Medications are listed below. Pertinent changes include increasing Crestor , adding Plavix  as above, changing Prilosec to Protonix .   _____________  Cath/PCI Registry Performance & Quality Measures: Aspirin  prescribed? - Yes ADP Receptor Inhibitor (Plavix /Clopidogrel , Brilinta /Ticagrelor  or Effient/Prasugrel) prescribed (includes medically managed patients)? - Yes High Intensity Statin (Lipitor 40-80mg  or Crestor  20-40mg ) prescribed? - Yes For EF <40%, was ACEI/ARB prescribed? - Yes For EF <40%, Aldosterone Antagonist (Spironolactone or Eplerenone) prescribed? - Not Applicable (EF >/= 40%) Cardiac Rehab Phase II ordered (Included Medically managed Patients)? - Yes _____________  Discharge Vitals Blood pressure 118/82, pulse 62, temperature 98 F (36.7 C), temperature source Oral, resp. rate 17, height 5' 10 (1.778 m), weight 77.6 kg, SpO2 97%.  Filed Weights   03/22/24 0729  Weight: 77.6  kg   Last Labs & Radiologic Studies    CBC No results for input(s): WBC, NEUTROABS, HGB, HCT, MCV, PLT in the last 72 hours. Basic Metabolic Panel Recent Labs    87/83/74 0740  NA 137  K 3.8  CL 99  CO2 27  GLUCOSE 160*  BUN 8  CREATININE 0.93  CALCIUM  8.8*   Liver Function  Tests No results for input(s): AST, ALT, ALKPHOS, BILITOT, PROT, ALBUMIN in the last 72 hours. No results for input(s): LIPASE, AMYLASE in the last 72 hours. High Sensitivity Troponin:   No results for input(s): TROPONINIHS in the last 720 hours.  BNP Invalid input(s): POCBNP D-Dimer No results for input(s): DDIMER in the last 72 hours. Hemoglobin A1C No results for input(s): HGBA1C in the last 72 hours. Fasting Lipid Panel No results for input(s): CHOL, HDL, LDLCALC, TRIG, CHOLHDL, LDLDIRECT in the last 72 hours. Thyroid Function Tests No results for input(s): TSH, T4TOTAL, T3FREE, THYROIDAB in the last 72 hours.  Invalid input(s): FREET3 _____________  CARDIAC CATHETERIZATION Result Date: 03/22/2024 Images from the original result were not included.   LV end diastolic pressure is normal. Coronary angiography & intervention 03/22/2024: LM: No significant disease LAD: Proximal/mid calcified 30% disease          Mid to distal LAD tandem 80% stenoses, followed by 60% disease Lcx: Proximal 50% disease, distal focal 80% stenosis RCA: Dominant vessel, proximal/mid 20% disease           Distal RCA focal 90% ISR, RPDA 70% disease LVEDP 13 mmHg Successful percutaneous coronary intervention dRCA        Drug coated balloon angioplasty 3.0 X 20 mm (per PREVAIL trial) dRCA        Overlapping stent 3.0 X 16 mm Synergy drug-eluting stent, deployed at 16 atm (stent placement necessitated by focal dissection)        0% residual stenosis at the lesion, TIMI-3 flow We will consider symptom guided treatment of mid to distal LAD, potentially with drug-coated balloon as part of peripheral clinical trial, after 30 days from today. Manish JINNY Lawrence, MD    Disposition   Pt is being discharged home today in good condition per MD.  Follow-up Plans & Appointments   Follow-up appointment scheduled with Lum Louis, NP 04/12/2024  Future Appointments  Date Time  Provider Department Center  04/12/2024 10:55 AM Louis Lum CROME, NP [facility]   Discharge Instructions     Amb Referral to Cardiac Rehabilitation   Complete by: As directed    Patient lives in [place]. Not interested in participating in CR at this time.   Diagnosis: Coronary Stents   After initial evaluation and assessments completed: Virtual Based Care may be provided alone or in conjunction with Phase 2 Cardiac Rehab based on patient barriers.: Yes   Intensive Cardiac Rehabilitation (ICR) [facility] location only OR Traditional Cardiac Rehabilitation (TCR) *If criteria for ICR are not met will enroll in TCR [facility] only): Yes      Discharge Medications   Allergies as of 03/22/2024       Reactions   Novocain [procaine Hcl]    Pt thinks he had a reaction to this med        Medication List     PAUSE taking these medications    metFORMIN 1000 MG tablet Wait to take this until: March 24, 2024 Commonly known as: GLUCOPHAGE Take 1,000 mg by mouth 2 (two) times daily with a meal.  STOP taking these medications    omeprazole 20 MG capsule Commonly known as: PRILOSEC       TAKE these medications    aspirin  EC 81 MG tablet Take 81 mg by mouth daily.   clopidogrel  75 MG tablet Commonly known as: PLAVIX  Take 1 tablet (75 mg total) by mouth daily with breakfast. Start taking on: March 23, 2024   empagliflozin 25 MG Tabs tablet Commonly known as: JARDIANCE Take 25 mg by mouth daily.   isosorbide mononitrate 30 MG 24 hr tablet Commonly known as: IMDUR Take 30 mg by mouth daily.   lisinopril 5 MG tablet Commonly known as: ZESTRIL Take 2.5 mg by mouth daily.   metoprolol tartrate 25 MG tablet Commonly known as: LOPRESSOR Take 1 tablet (25 mg total) by mouth 2 (two) times daily. NEED APPOINTMENT FOR FUTURE REFILLS.   nitroGLYCERIN  0.4 MG SL tablet Commonly known as: NITROSTAT  Place 0.4 mg under the tongue every 5 (five) minutes as needed for chest  pain.   pantoprazole  20 MG tablet Commonly known as: PROTONIX  Take 1 tablet (20 mg total) by mouth daily before breakfast.   rosuvastatin  20 MG tablet Commonly known as: CRESTOR  Take 1 tablet (20 mg total) by mouth daily. What changed: Another medication with the same name was removed. Continue taking this medication, and follow the directions you see here.   venlafaxine XR 75 MG 24 hr capsule Commonly known as: EFFEXOR-XR Take 75 mg by mouth every other day.        Allergies Allergies[1]  Outstanding Labs/Studies   N/A  Duration of Discharge Encounter   Greater than 30 minutes including physician time.  Signed, Waddell DELENA Donath, PA-C 03/22/2024, 4:00 PM     [1]  Allergies Allergen Reactions   Novocain [Procaine Hcl]     Pt thinks he had a reaction to this med

## 2024-03-22 NOTE — H&P (Signed)
 = Cardiology Office Note:  .   Date:  03/22/2024  ID:  Dennis Wilcox, DOB [birth year], MRN [id number] PCP: Dennis Mems, MD  [facility] Providers Cardiologist:  Dennis Lawrence, MD PCP: Dennis Mems, MD  C/C: Chest pain  Dennis Wilcox is a 58 y.o. male with hypertension, hyperlipidemia, type II DM, CAD, nicotine dependence  Discussed the use of AI scribe software for clinical note transcription with the patient, who gave verbal consent to proceed.  History of Present Illness  Patient does not have typical chest pain symptoms, but gets out of breath easily.  He stays busy with good work etc., and is not able to do as much as he could do before.  The symptoms are similar to what he experienced before his MI in 2012.  Reviewed recent stress test results with the patient, details below.    Vitals:   03/22/24 0729  BP: 126/84  Pulse: 64  Resp: 16  Temp: 98 F (36.7 C)  SpO2: 98%       Review of Systems  Constitutional: Positive for malaise/fatigue.  Cardiovascular:  Positive for dyspnea on exertion. Negative for chest pain, leg swelling, palpitations and syncope.        Studies Reviewed: SABRA        EKG 03/07/2024: Normal sinus rhythm Normal ECG When compared with ECG of 27-Oct-2023 14:54, QRS axis Shifted right Criteria for Inferior infarct are no longer Present   Echocardiogram 12/2023: 1. Left ventricular ejection fraction, by estimation, is 60 to 65%. Left  ventricular ejection fraction by 3D volume is 65 %. The left ventricle has  normal function. The left ventricle has no regional wall motion  abnormalities. There is mild left  ventricular hypertrophy. Left ventricular diastolic parameters were  normal. The average left ventricular global longitudinal strain is -20.7  %. The global longitudinal strain is normal.   2. Right ventricular systolic function is normal. The right ventricular  size is normal. Tricuspid regurgitation signal is  inadequate for assessing  PA pressure.   3. The mitral valve is normal in structure. Trivial mitral valve  regurgitation. No evidence of mitral stenosis.   4. The aortic valve is tricuspid. Aortic valve regurgitation is not  visualized. Aortic valve sclerosis/calcification is present, without any  evidence of aortic stenosis.   5. The inferior vena cava is normal in size with greater than 50%  respiratory variability, suggesting right atrial pressure of 3 mmHg.   Stress test 12/2023:   Findings are consistent with ischemia. The study is low risk.   No diagnostic ST deviation was noted. Nondiagnostic ST depression in the inferolateral leads noted in recovery.   LV perfusion is abnormal. There is evidence of ischemia. There is no evidence of infarction. Defect 1: There is a small defect with mild reduction in uptake present in the apical inferior location(s) that is reversible. There is normal wall motion in the defect area. Consistent with ischemia.   Nuclear stress EF: 63%. The left ventricular ejection fraction is normal (55-65%). End diastolic cavity size is normal. End systolic cavity size is normal.   CT images were obtained for attenuation correction and were examined for the presence of coronary calcium  when appropriate.   Coronary calcium  was present on the attenuation correction CT images. Severe coronary calcifications were present. Coronary calcifications were present in the left anterior descending artery, left circumflex artery and right coronary artery distribution(s).   Prior study available for comparison from 01/29/2018.   Abnormal stress  nuclear study with mild ischemia in the distal inferior wall/apex; ejection fraction 63% with normal wall motion; severe calcification noted in all 3 coronary distributions; overall felt to be a low risk study.  Labs 07/2023: Chol 168, TG 156, HDL 42, LDL 98 HbA1C 7.9% Hb 14.2 Cr NA TSH 3.2   Physical Exam Vitals and nursing note  reviewed.  Constitutional:      General: He is not in acute distress. Neck:     Vascular: No JVD.  Cardiovascular:     Rate and Rhythm: Normal rate and regular rhythm.     Heart sounds: Normal heart sounds. No murmur heard. Pulmonary:     Effort: Pulmonary effort is normal.     Breath sounds: Normal breath sounds. No wheezing or rales.  Musculoskeletal:     Right lower leg: No edema.     Left lower leg: No edema.      VISIT DIAGNOSES: No diagnosis found.     Dennis Wilcox is a 58 y.o. male with hypertension, hyperlipidemia, type II DM, CAD, nicotine dependence Assessment & Plan  CAD: Primary PCI to RCA for STEMI in 2012. Severe multivessel calcification, with mild inferoapical ischemia on stress test 01/2024. I suspect he is having angina equivalent symptoms with exertional dyspnea and getting fatigued while doing physical activity.  Symptoms are similar to what he experienced before his MI in 2012. He is on optimal medical therapy with aspirin , statin, Toprol tartrate, Imdur, lisinopril, with no improvement in symptoms. Recommend coronary angiography and possible intervention.  Nicotine dependence: Recommend smoking cessation, but patient is not ready to quit at this time.  Hypertension: Well-controlled.  Mixed hyperlipidemia: As above  Type 2 diabetes mellitus: Management as per PCP, goal A1c <7%.   Informed Consent   Shared Decision Making/Informed Consent The risks [stroke (1 in 1000), death (1 in 1000), kidney failure [usually temporary] (1 in 500), bleeding (1 in 200), allergic reaction [possibly serious] (1 in 200)], benefits (diagnostic support and management of coronary artery disease) and alternatives of a cardiac catheterization were discussed in detail with [name] and he is willing to proceed.      F/u in 1 year  Signed, Dennis JINNY Lawrence, MD

## 2024-03-23 LAB — POCT ACTIVATED CLOTTING TIME
Activated Clotting Time: 245 s
Activated Clotting Time: 276 s
Activated Clotting Time: 286 s

## 2024-03-30 ENCOUNTER — Emergency Department (HOSPITAL_COMMUNITY)

## 2024-03-30 ENCOUNTER — Encounter (HOSPITAL_COMMUNITY)
Admission: EM | Disposition: A | Discharge status: Home / Self Care | Payer: Self-pay | Source: Home / Self Care | Attending: Emergency Medicine

## 2024-03-30 ENCOUNTER — Observation Stay (HOSPITAL_COMMUNITY)
Admission: EM | Admit: 2024-03-30 | Discharge: 2024-03-30 | Disposition: A | Discharge status: Home / Self Care | Attending: Cardiovascular Disease | Admitting: Cardiovascular Disease

## 2024-03-30 DIAGNOSIS — E1122 Type 2 diabetes mellitus with diabetic chronic kidney disease: Secondary | ICD-10-CM | POA: Diagnosis not present

## 2024-03-30 DIAGNOSIS — N182 Chronic kidney disease, stage 2 (mild): Secondary | ICD-10-CM | POA: Insufficient documentation

## 2024-03-30 DIAGNOSIS — Z87891 Personal history of nicotine dependence: Secondary | ICD-10-CM | POA: Insufficient documentation

## 2024-03-30 DIAGNOSIS — I10 Essential (primary) hypertension: Secondary | ICD-10-CM | POA: Diagnosis present

## 2024-03-30 DIAGNOSIS — I2510 Atherosclerotic heart disease of native coronary artery without angina pectoris: Secondary | ICD-10-CM | POA: Diagnosis not present

## 2024-03-30 DIAGNOSIS — I200 Unstable angina: Secondary | ICD-10-CM

## 2024-03-30 DIAGNOSIS — I129 Hypertensive chronic kidney disease with stage 1 through stage 4 chronic kidney disease, or unspecified chronic kidney disease: Secondary | ICD-10-CM | POA: Diagnosis not present

## 2024-03-30 DIAGNOSIS — R0789 Other chest pain: Secondary | ICD-10-CM | POA: Diagnosis not present

## 2024-03-30 DIAGNOSIS — Z7982 Long term (current) use of aspirin: Secondary | ICD-10-CM | POA: Diagnosis not present

## 2024-03-30 DIAGNOSIS — R079 Chest pain, unspecified: Principal | ICD-10-CM | POA: Diagnosis present

## 2024-03-30 DIAGNOSIS — E782 Mixed hyperlipidemia: Secondary | ICD-10-CM | POA: Insufficient documentation

## 2024-03-30 HISTORY — PX: LEFT HEART CATH AND CORONARY ANGIOGRAPHY: CATH118249

## 2024-03-30 LAB — CBC WITH DIFFERENTIAL/PLATELET
Abs Immature Granulocytes: 0.03 K/uL (ref 0.00–0.07)
Basophils Absolute: 0.1 K/uL (ref 0.0–0.1)
Basophils Relative: 1 %
Eosinophils Absolute: 0.3 K/uL (ref 0.0–0.5)
Eosinophils Relative: 4 %
HCT: 46.8 % (ref 39.0–52.0)
Hemoglobin: 16.2 g/dL (ref 13.0–17.0)
Immature Granulocytes: 0 %
Lymphocytes Relative: 51 %
Lymphs Abs: 4 K/uL (ref 0.7–4.0)
MCH: 32.8 pg (ref 26.0–34.0)
MCHC: 34.6 g/dL (ref 30.0–36.0)
MCV: 94.7 fL (ref 80.0–100.0)
Monocytes Absolute: 0.5 K/uL (ref 0.1–1.0)
Monocytes Relative: 6 %
Neutro Abs: 3 K/uL (ref 1.7–7.7)
Neutrophils Relative %: 38 %
Platelets: 392 K/uL (ref 150–400)
RBC: 4.94 MIL/uL (ref 4.22–5.81)
RDW: 12.2 % (ref 11.5–15.5)
WBC: 7.9 K/uL (ref 4.0–10.5)
nRBC: 0 % (ref 0.0–0.2)

## 2024-03-30 LAB — TROPONIN T, HIGH SENSITIVITY
Troponin T High Sensitivity: <15 ng/L (ref 0–19)
Troponin T High Sensitivity: <15 ng/L (ref 0–19)

## 2024-03-30 LAB — COMPREHENSIVE METABOLIC PANEL WITH GFR
ALT: 17 U/L (ref 0–44)
AST: 20 U/L (ref 15–41)
Albumin: 4.3 g/dL (ref 3.5–5.0)
Alkaline Phosphatase: 104 U/L (ref 38–126)
Anion gap: 12 (ref 5–15)
BUN: 8 mg/dL (ref 6–20)
CO2: 25 mmol/L (ref 22–32)
Calcium: 9.4 mg/dL (ref 8.9–10.3)
Chloride: 98 mmol/L (ref 98–111)
Creatinine, Ser: 1.26 mg/dL — ABNORMAL HIGH (ref 0.61–1.24)
GFR, Estimated: >60 mL/min
Glucose, Bld: 266 mg/dL — ABNORMAL HIGH (ref 70–99)
Potassium: 4.3 mmol/L (ref 3.5–5.1)
Sodium: 135 mmol/L (ref 135–145)
Total Bilirubin: 0.4 mg/dL (ref 0.0–1.2)
Total Protein: 7.1 g/dL (ref 6.5–8.1)

## 2024-03-30 LAB — PROTIME-INR
INR: 1 (ref 0.8–1.2)
Prothrombin Time: 13.3 s (ref 11.4–15.2)

## 2024-03-30 MED ORDER — MIDAZOLAM HCL 2 MG/2ML IJ SOLN
INTRAMUSCULAR | Status: AC
  Filled 2024-03-30: qty 2

## 2024-03-30 MED ORDER — HEPARIN (PORCINE) IN NACL 1000-0.9 UT/500ML-% IV SOLN
INTRAVENOUS | Status: DC | PRN
  Administered 2024-03-30 (×2): 500 mL

## 2024-03-30 MED ORDER — NITROGLYCERIN 1 MG/10 ML FOR IR/CATH LAB
INTRA_ARTERIAL | Status: DC | PRN
  Administered 2024-03-30: 200 ug via INTRACORONARY

## 2024-03-30 MED ORDER — FENTANYL CITRATE (PF) 100 MCG/2ML IJ SOLN
INTRAMUSCULAR | Status: DC | PRN
  Administered 2024-03-30: 25 ug via INTRAVENOUS

## 2024-03-30 MED ORDER — SODIUM CHLORIDE 0.9 % IV SOLN
INTRAVENOUS | Status: DC | PRN
  Administered 2024-03-30: 100 mL/h via INTRAVENOUS

## 2024-03-30 MED ORDER — VERAPAMIL HCL 2.5 MG/ML IV SOLN
INTRAVENOUS | Status: AC
  Filled 2024-03-30: qty 2

## 2024-03-30 MED ORDER — NITROGLYCERIN 0.4 MG SL SUBL: 0.4000 mg | SUBLINGUAL_TABLET | SUBLINGUAL | Status: DC | PRN

## 2024-03-30 MED ORDER — LABETALOL HCL 5 MG/ML IV SOLN: 10.0000 mg | INTRAVENOUS | Status: DC | PRN

## 2024-03-30 MED ORDER — CLOPIDOGREL BISULFATE 75 MG PO TABS: 75.0000 mg | ORAL_TABLET | Freq: Once | ORAL | Status: DC

## 2024-03-30 MED ORDER — FENTANYL CITRATE (PF) 100 MCG/2ML IJ SOLN
INTRAMUSCULAR | Status: AC
  Filled 2024-03-30: qty 2

## 2024-03-30 MED ORDER — ONDANSETRON HCL 4 MG/2ML IJ SOLN: 4.0000 mg | Freq: Four times a day (QID) | INTRAMUSCULAR | Status: DC | PRN

## 2024-03-30 MED ORDER — CLOPIDOGREL BISULFATE 75 MG PO TABS: 75.0000 mg | ORAL_TABLET | Freq: Every day | ORAL | Status: DC

## 2024-03-30 MED ORDER — HYDRALAZINE HCL 20 MG/ML IJ SOLN: 10.0000 mg | INTRAMUSCULAR | Status: DC | PRN

## 2024-03-30 MED ORDER — CLOPIDOGREL BISULFATE 75 MG PO TABS
75.0000 mg | ORAL_TABLET | Freq: Once | ORAL | Status: AC
  Administered 2024-03-30: 75 mg via ORAL
  Filled 2024-03-30: qty 1

## 2024-03-30 MED ORDER — NITROGLYCERIN 0.4 MG SL SUBL: 0.4000 mg | SUBLINGUAL_TABLET | SUBLINGUAL | 2 refills | Status: AC | PRN

## 2024-03-30 MED ORDER — LIDOCAINE HCL (PF) 1 % IJ SOLN
INTRAMUSCULAR | Status: DC | PRN
  Administered 2024-03-30: 2 mL

## 2024-03-30 MED ORDER — SODIUM CHLORIDE 0.9 % IV SOLN: INTRAVENOUS | Status: AC

## 2024-03-30 MED ORDER — ISOSORBIDE MONONITRATE ER 30 MG PO TB24: 30.0000 mg | ORAL_TABLET | Freq: Every day | ORAL | Status: DC

## 2024-03-30 MED ORDER — ASPIRIN 81 MG PO TBEC: 81.0000 mg | DELAYED_RELEASE_TABLET | Freq: Every day | ORAL | Status: DC

## 2024-03-30 MED ORDER — IOHEXOL 350 MG/ML SOLN
INTRAVENOUS | Status: DC | PRN
  Administered 2024-03-30: 18 mL via INTRA_ARTERIAL

## 2024-03-30 MED ORDER — FREE WATER: 500.0000 mL | Freq: Once | Status: DC

## 2024-03-30 MED ORDER — HEPARIN SODIUM (PORCINE) 1000 UNIT/ML IJ SOLN
INTRAMUSCULAR | Status: AC
  Filled 2024-03-30: qty 10

## 2024-03-30 MED ORDER — ROSUVASTATIN CALCIUM 20 MG PO TABS: 20.0000 mg | ORAL_TABLET | Freq: Every day | ORAL | Status: DC

## 2024-03-30 MED ORDER — ACETAMINOPHEN 325 MG PO TABS: 650.0000 mg | ORAL_TABLET | ORAL | Status: DC | PRN

## 2024-03-30 MED ORDER — ISOSORBIDE MONONITRATE ER 30 MG PO TB24: 60.0000 mg | ORAL_TABLET | Freq: Every day | ORAL | 3 refills | Status: DC

## 2024-03-30 MED ORDER — HEPARIN SODIUM (PORCINE) 1000 UNIT/ML IJ SOLN
INTRAMUSCULAR | Status: DC | PRN
  Administered 2024-03-30: 4000 [IU] via INTRAVENOUS

## 2024-03-30 MED ORDER — LIDOCAINE HCL (PF) 1 % IJ SOLN
INTRAMUSCULAR | Status: AC
  Filled 2024-03-30: qty 30

## 2024-03-30 MED ORDER — MIDAZOLAM HCL (PF) 2 MG/2ML IJ SOLN
INTRAMUSCULAR | Status: DC | PRN
  Administered 2024-03-30: 1 mg via INTRAVENOUS

## 2024-03-30 MED ORDER — SODIUM CHLORIDE 0.9 % IV SOLN: 250.0000 mL | INTRAVENOUS | Status: DC | PRN

## 2024-03-30 MED ORDER — SODIUM CHLORIDE 0.9% FLUSH: 3.0000 mL | Freq: Two times a day (BID) | INTRAVENOUS | Status: DC

## 2024-03-30 MED ORDER — VERAPAMIL HCL 2.5 MG/ML IV SOLN
INTRAVENOUS | Status: DC | PRN
  Administered 2024-03-30: 10 mL via INTRA_ARTERIAL

## 2024-03-30 MED ORDER — PANTOPRAZOLE SODIUM 20 MG PO TBEC: 20.0000 mg | DELAYED_RELEASE_TABLET | Freq: Every day | ORAL | Status: DC

## 2024-03-30 MED ORDER — SODIUM CHLORIDE 0.9% FLUSH: 3.0000 mL | INTRAVENOUS | Status: DC | PRN

## 2024-03-30 MED ORDER — ISOSORBIDE MONONITRATE ER 30 MG PO TB24
60.0000 mg | ORAL_TABLET | Freq: Every day | ORAL | 3 refills | Status: DC
  Filled 2024-03-30: qty 90, 45d supply, fill #0

## 2024-03-30 MED ORDER — NITROGLYCERIN 1 MG/10 ML FOR IR/CATH LAB
INTRA_ARTERIAL | Status: AC
  Filled 2024-03-30: qty 10

## 2024-03-30 MED ORDER — EMPAGLIFLOZIN 25 MG PO TABS: 25.0000 mg | ORAL_TABLET | Freq: Every day | ORAL | Status: DC

## 2024-03-30 NOTE — Progress Notes (Signed)
 Pt and wife received discharge instructions, teach back performed. Iv removed, no complications. Rt radial site is clean dry intact, site is soft, no signs of bleeding. Pt escorted out via wheelchair to wife's vehicle.

## 2024-03-30 NOTE — ED Triage Notes (Signed)
 PT had stent placed last week and has been feeling chest tightness coming and going. Yesterday the pain was constant and had not gone away. Pt states pain and tightness feels worse today than before the stent was placed. Pt also complains of increased fatigue and weakness. Tightness is all the way across chest.

## 2024-03-30 NOTE — ED Provider Triage Note (Signed)
 Emergency Medicine Provider Triage Evaluation Note  Dennis Wilcox , a 58 y.o. male  was evaluated in triage.  Pt complains of chest tightness that is intermittent, had recent coronary angioplasty on 16 December, has had intermittent tightness since then.  States that it feels like a belt tightening around his chest, though does not endorse any dyspnea.  Does have increased fatigue and generalized weakness..  Review of Systems  Positive: As above Negative:   Physical Exam  BP (!) 148/83   Pulse 67   Temp 98.2 F (36.8 C)   Resp 18   Wt 77 kg   SpO2 98%   BMI 24.36 kg/m  Gen:   Awake, no distress   Resp:  Normal effort  MSK:   Moves extremities without difficulty  Other:    Medical Decision Making  Medically screening exam initiated at 1:00 PM.  Appropriate orders placed.  Dennis Wilcox was informed that the remainder of the evaluation will be completed by another provider, this initial triage assessment does not replace that evaluation, and the importance of remaining in the ED until their evaluation is complete.  Prior to assessment initial order set placed, CBC and PT/INR is normal, troponin and CMP pending.  Chest x-ray does not show any acute abnormality.   Myriam Dorn BROCKS, [place] 03/30/24 1310

## 2024-03-30 NOTE — ED Provider Notes (Signed)
 " [facility] Provider Note   CSN: [id number] Arrival date & time: 03/30/24  1209     History {Add pertinent medical, surgical, social history, OB history to HPI:1} Chief Complaint  Patient presents with   Chest Pain    Dennis Wilcox is a 58 y.o. male with hypertension, hyperlipidemia, type 2 diabetes, nicotine dependence, coronary artery disease s/p PCI to RCA for STEMI in 2012 and s/p PCI DES to RCA on 03/22/24 who presents to ED with chest tightness that is intermittent, had recent coronary angioplasty on 16 December, has had intermittent tightness since then.  States that it feels like a belt tightening around his chest, though does not endorse any dyspnea.  Does have increased fatigue and generalized weakness.   Past Medical History:  Diagnosis Date   Atherosclerotic heart disease of native coronary artery without angina pectoris    CAD (coronary artery disease)    CKD (chronic kidney disease), stage II    Depression    DM (diabetes mellitus) (HCC)    Hyperlipemia 08/06/2010   [facility] completed ;repeated Feb 2013   Myocardial infarction (HCC) 06/30/2010   infer wall MI, cath 3 vessel CAD   Psoriasis        Home Medications Prior to Admission medications  Medication Sig Start Date End Date Taking? Authorizing Provider  aspirin  EC 81 MG tablet Take 81 mg by mouth daily.    [provider]  clopidogrel  (PLAVIX ) 75 MG tablet Take 1 tablet (75 mg total) by mouth daily with breakfast. 03/23/24   Henry Shaver B, NP  empagliflozin  (JARDIANCE ) 25 MG TABS tablet Take 25 mg by mouth daily. 01/25/20   [provider]  isosorbide  mononitrate (IMDUR ) 30 MG 24 hr tablet Take 30 mg by mouth daily.     [provider]  lisinopril (PRINIVIL,ZESTRIL) 5 MG tablet Take 2.5 mg by mouth daily.     [provider]  metFORMIN (GLUCOPHAGE) 1000 MG tablet Take 1,000 mg by mouth 2 (two) times daily with  a meal.     [provider]  metoprolol tartrate (LOPRESSOR) 25 MG tablet Take 1 tablet (25 mg total) by mouth 2 (two) times daily. NEED APPOINTMENT FOR FUTURE REFILLS. 04/14/14   Burnard Debby LABOR, MD  nitroGLYCERIN  (NITROSTAT ) 0.4 MG SL tablet Place 0.4 mg under the tongue every 5 (five) minutes as needed for chest pain.    [provider]  pantoprazole  (PROTONIX ) 20 MG tablet Take 1 tablet (20 mg total) by mouth daily before breakfast. 03/22/24   Henry Shaver NOVAK, NP  rosuvastatin  (CRESTOR ) 20 MG tablet Take 1 tablet (20 mg total) by mouth daily. Patient not taking: Reported on 03/17/2024 10/27/23   Elmira Newman PARAS, MD  venlafaxine XR (EFFEXOR-XR) 75 MG 24 hr capsule Take 75 mg by mouth every other day.     [provider]      Allergies    Novocain [procaine hcl]    Review of Systems   Review of Systems A 10 point review of systems was performed and is negative unless otherwise reported in HPI.  Physical Exam Updated Vital Signs BP (!) 148/83   Pulse 67   Temp 98.2 F (36.8 C)   Resp 18   Wt 77 kg   SpO2 98%   BMI 24.36 kg/m  Physical Exam General: Normal appearing {Desc; male/male:11659}, lying in bed.  HEENT: PERRLA, Sclera anicteric, MMM, trachea midline.  Cardiology: RRR, no murmurs/rubs/gallops. BL  radial and DP pulses equal bilaterally.  Resp: Normal respiratory rate and effort. CTAB, no wheezes, rhonchi, crackles.  Abd: Soft, non-tender, non-distended. No rebound tenderness or guarding.  GU: Deferred. MSK: No peripheral edema or signs of trauma. Extremities without deformity or TTP. No cyanosis or clubbing. Skin: warm, dry. No rashes or lesions. Back: No CVA tenderness Neuro: A&Ox4, CNs II-XII grossly intact. MAEs. Sensation grossly intact.  Psych: Normal mood and affect.   ED Results / Procedures / Treatments   Labs (all labs ordered are listed, but only abnormal results are displayed) Labs Reviewed  COMPREHENSIVE METABOLIC PANEL  WITH GFR - Abnormal; Notable for the following components:      Result Value   Glucose, Bld 266 (*)    Creatinine, Ser 1.26 (*)    All other components within normal limits  CBC WITH DIFFERENTIAL/PLATELET  PROTIME-INR  TROPONIN T, HIGH SENSITIVITY    EKG None  Radiology DG Chest 1 View Result Date: 03/30/2024 EXAM: 1 VIEW XRAY OF THE CHEST 03/30/2024 12:33:00 PM COMPARISON: None available. CLINICAL HISTORY: Chest pain FINDINGS: LUNGS AND PLEURA: No focal pulmonary opacity. No pleural effusion. No pneumothorax. HEART AND MEDIASTINUM: Atherosclerotic calcification of aortic arch. No acute abnormality of the cardiac and mediastinal silhouettes. BONES AND SOFT TISSUES: No acute osseous abnormality. Multilevel thoracic osteophytosis. IMPRESSION: 1. No acute cardiopulmonary abnormality. Electronically signed by: Rogelia Myers MD 03/30/2024 01:03 PM EST RP Workstation: [id number]    Procedures Procedures  {Document cardiac monitor, telemetry assessment procedure when appropriate:1}  Medications Ordered in ED Medications - No data to display  ED Course/ Medical Decision Making/ A&P                          Medical Decision Making Amount and/or Complexity of Data Reviewed Labs: ordered. Decision-making details documented in ED Course. Radiology: ordered. Decision-making details documented in ED Course.    This patient presents to the ED for concern of ***, this involves an extensive number of treatment options, and is a complaint that carries with it a high risk of complications and morbidity.  I considered the following differential and admission for this acute, potentially life threatening condition.   MDM:    ***  Clinical Course as of 03/30/24 1400  Wed Mar 30, 2024  1355 Troponin T High Sensitivity: <15 First one neg, will trend [HN]  1356 Glucose(!): 266 Mild hyperglycemia, no DKA [HN]  1356 CBC with Differential wnl [HN]  1356 DG Chest 1 View 1. No acute  cardiopulmonary abnormality. [HN]    Clinical Course User Index [HN] Franklyn Sid SAILOR, MD    Labs: I Ordered, and personally interpreted labs.  The pertinent results include:  ***  Imaging Studies ordered: I ordered imaging studies including *** I independently visualized and interpreted imaging. I agree with the radiologist interpretation  Additional history obtained from ***.  External records from outside source obtained and reviewed including ***  Cardiac Monitoring: The patient was maintained on a cardiac monitor.  I personally viewed and interpreted the cardiac monitored which showed an underlying rhythm of: ***  Reevaluation: After the interventions noted above, I reevaluated the patient and found that they have :{resolved/improved/worsened:23923::improved}  Social Determinants of Health: ***  Disposition:  ***  Co morbidities that complicate the patient evaluation  Past Medical History:  Diagnosis Date   Atherosclerotic heart disease of native coronary artery without angina pectoris    CAD (coronary artery disease)    CKD (chronic kidney  disease), stage II    Depression    DM (diabetes mellitus) (HCC)    Hyperlipemia 08/06/2010   [facility] completed ;repeated Feb 2013   Myocardial infarction (HCC) 06/30/2010   infer wall MI, cath 3 vessel CAD   Psoriasis      Medicines No orders of the defined types were placed in this encounter.   I have reviewed the patients home medicines and have made adjustments as needed  Problem List / ED Course: Problem List Items Addressed This Visit   None        {Document critical care time when appropriate:1} {Document review of labs and clinical decision tools ie heart score, Chads2Vasc2 etc:1}  {Document your independent review of radiology images, and any outside records:1} {Document your discussion with family members, caretakers, and with consultants:1} {Document social determinants of health affecting pt's  care:1} {Document your decision making why or why not admission, treatments were needed:1}  This note was created using dictation software, which may contain spelling or grammatical errors.  "

## 2024-03-30 NOTE — Discharge Summary (Cosign Needed Addendum)
 " Discharge Summary   Patient ID: Dennis Wilcox MRN: [id number]; DOB: 09/03/1965  Admit date: 03/30/2024 Discharge date: 03/30/2024  PCP:  Verena Mems, MD   [facility] Providers Cardiologist:  Newman JINNY Lawrence, MD     Discharge Diagnoses  Principal Problem:   Chest pain Active Problems:   CAD (coronary artery disease)   Mixed hyperlipidemia   HTN (hypertension)   Diagnostic Studies/Procedures   LHC 03/30/24   Mid LAD to Dist LAD lesion is 70% stenosed.   Dist LAD lesion is 60% stenosed.   Prox LAD to Mid LAD lesion is 30% stenosed.   Dist Cx lesion is 80% stenosed.   Ost Cx to Prox Cx lesion is 50% stenosed.   Prox RCA lesion is 20% stenosed.   Mid RCA lesion is 20% stenosed.   RPDA-2 lesion is 70% stenosed.   Non-stenotic Dist RCA lesion was previously treated.   Non-stenotic RPDA-1 lesion was previously treated.   LV end diastolic pressure is normal.   Coronary angiography 12//2025: LM: No significant disease LAD: Proximal/mid calcified 30% disease          Mid to distal LAD tandem 80% stenoses, followed by 60% disease Lcx: Proximal 50% disease, distal focal 80% stenosis RCA: Dominant vessel, proximal/mid 20% disease           Distal RCA focal 90% ISR, RPDA 70% disease      Conclusion: Moderate to severe multivessel CAD Patnet dRCA stent. Disease in LAD and Lcx is either diffsue or in too small caliber vessels and not amenable to PCI This could be cause of his angian symptoms.   Recommendation: Continued medical maangement with anti anginal agents.  Increase Imdur  to 60 mg daily Continue metoprolol tartrate 25 mg bid Will add SL NTG Will give one time dose of Plavix  75 mg since he did not take it today at home   Discharge home after bed rest over. Keep outpatient f/u on 04/12/2024. _____________   History of Present Illness   Dennis Wilcox is a 58 y.o. male with a h/o HTN, HLD, DM2, nicotine dependence, cAD s/p PCI to the RCA for  STEMI in 2012 and recent DES to RCA 03/2024 who is being evaluated for chest pain.   He was recently discharged 03/22/24 for same day PCI after undergoing cardiac cath with stent placement to the distal RCA. Reporterd intermittent chest pain, fatigue since cath with stent placement. Reports no issues with taking DAPT, missed dose morning 12/24. Reports fatigue, intermittent band-like chest pain, 5/10.  Troponins negative.  Mild AKI.  EKG with no acute ST-T wave changes.  Case discussed with MDs and proceeded with cardiac catheterization.   Hospital Course   Consultants: None  Chest pain CAD s/p STEMI 2012 and recent DES to RCA 03/2024 Hyperlipidemia -Recent LHC showed: 30% prox/mid LAD, 80% then 60% in dLAD, 90% ISR to dRCA treated with DES  Cardiac cath showed moderate to severe multivessel CAD, patent dRCA stent. Disease in the LAD and Lcx is either diffuse or in too small caliber vessels and not amenable to PCI. This may be cause of anginal symptoms. No PCI was performed. Cath site remained stable post procedure.  Plan for medical management. We will give Plavix  75mg  daily once while here. At d/c plan to increase Imdur  to 60mg  daily and add SL NTG. Will continue DAPT with aspirin  and Plavix  for at least 6 months.  Continue rosuvastatin  20 mg.  Needs updated lipid panel at follow-up.  Continue Lopressor 25 mg twice daily   Hypertension  Continue Imdur  60 mg daily, Lopressor 25 mg twice daily, lisinopril 5 mg.   Type 2 diabetes  On metformin at home.  Resume 48 hours after cardiac catheterization   Nicotine dependence  Encouraged cessation.  The patient was seen by Dr. Elmira and felt to be stable from a cardiac perspective.  Medication sent to his home pharmacy.  Follow-up already arranged.    Did the patient have an acute coronary syndrome (MI, NSTEMI, STEMI, etc) this admission?:  No                               Did the patient have a percutaneous coronary intervention (stent /  angioplasty)?:  No.       _____________  Discharge Vitals Blood pressure 126/85, pulse 68, temperature 98.2 F (36.8 C), resp. rate 12, weight 77 kg, SpO2 97%.  Filed Weights   03/30/24 1243  Weight: 77 kg    Labs & Radiologic Studies  CBC Recent Labs    03/30/24 1225  WBC 7.9  NEUTROABS 3.0  HGB 16.2  HCT 46.8  MCV 94.7  PLT 392   Basic Metabolic Panel Recent Labs    87/75/74 1225  NA 135  K 4.3  CL 98  CO2 25  GLUCOSE 266*  BUN 8  CREATININE 1.26*  CALCIUM  9.4   Liver Function Tests Recent Labs    03/30/24 1225  AST 20  ALT 17  ALKPHOS 104  BILITOT 0.4  PROT 7.1  ALBUMIN 4.3   No results for input(s): LIPASE, AMYLASE in the last 72 hours. High Sensitivity Troponin:   No results for input(s): TROPONINIHS in the last 720 hours.  Recent Labs  Lab 03/30/24 1225 03/30/24 1529  TRNPT <15 <15    BNP Invalid input(s): POCBNP No results for input(s): PROBNP in the last 72 hours.  No results for input(s): BNP in the last 72 hours.  D-Dimer No results for input(s): DDIMER in the last 72 hours. Hemoglobin A1C No results for input(s): HGBA1C in the last 72 hours. Fasting Lipid Panel No results for input(s): CHOL, HDL, LDLCALC, TRIG, CHOLHDL, LDLDIRECT in the last 72 hours. No results found for: LIPOA  Thyroid Function Tests No results for input(s): TSH, T4TOTAL, T3FREE, THYROIDAB in the last 72 hours.  Invalid input(s): FREET3 _____________  CARDIAC CATHETERIZATION Result Date: 03/30/2024 Images from the original result were not included. Coronary angiography 03/30/2024: LM: First angiogram showed ostial narrowing, resolved with IC NTG 200 mcg, suggestive of spasm LAD: Proximal/mid calcified 30% disease          Mid to distal LAD diffuse 70% disease (unable to appreciate focal tandem 80% stenoses noted on 03/22/2024) Lcx: Proximal 50% disease, distal focal 80% stenosis RCA: Dominant vessel, proximal/mid 20%  disease          Patent dRCA stent with no restenosis          RPDA 70% disease Conclusion: Moderate to severe multivessel CAD Patnet dRCA stent. Disease in LAD and Lcx is either diffsue or in too small caliber vessels and not amenable to PCI This could be cause of his angian symptoms. Recommendation: Continued medical maangement with anti anginal agents. Increase Imdur  to 60 mg daily Continue metoprolol tartrate 25 mg bid Will add SL NTG Will give one time dose of Plavix  75 mg since he did not take it today at home Discharge home  after bed rest over. Keep outpatient f/u on 04/12/2024. Newman JINNY Lawrence, MD  DG Chest 1 View Result Date: 03/30/2024 EXAM: 1 VIEW XRAY OF THE CHEST 03/30/2024 12:33:00 PM COMPARISON: None available. CLINICAL HISTORY: Chest pain FINDINGS: LUNGS AND PLEURA: No focal pulmonary opacity. No pleural effusion. No pneumothorax. HEART AND MEDIASTINUM: Atherosclerotic calcification of aortic arch. No acute abnormality of the cardiac and mediastinal silhouettes. BONES AND SOFT TISSUES: No acute osseous abnormality. Multilevel thoracic osteophytosis. IMPRESSION: 1. No acute cardiopulmonary abnormality. Electronically signed by: Rogelia Myers MD 03/30/2024 01:03 PM EST RP Workstation: [id number]   CARDIAC CATHETERIZATION Result Date: 03/22/2024 Images from the original result were not included. Coronary angiography & intervention 03/22/2024: LM: No significant disease LAD: Proximal/mid calcified 30% disease          Mid to distal LAD tandem 80% stenoses, followed by 60% disease Lcx: Proximal 50% disease, distal focal 80% stenosis RCA: Dominant vessel, proximal/mid 20% disease           Distal RCA focal 90% ISR, RPDA 70% disease LVEDP 13 mmHg Successful percutaneous coronary intervention dRCA        Drug coated balloon angioplasty 3.0 X 20 mm (per PREVAIL trial) dRCA        Overlapping stent 3.0 X 16 mm Synergy drug-eluting stent, deployed at 16 atm (stent placement necessitated by focal  dissection)        0% residual stenosis at the lesion, TIMI-3 flow We will consider symptom guided treatment of mid to distal LAD, potentially with drug-coated balloon as part of peripheral clinical trial, after 30 days from today. Manish JINNY Lawrence, MD    Disposition Pt is being discharged home today in good condition.  Follow-up Plans & Appointments  Follow-up Information     Rana Lum CROME, NP Follow up on 04/12/2024.   Specialty: Cardiology Contact information: [address] [place] [id number] [phone]                  Discharge Medications Allergies as of 03/30/2024       Reactions   Novocain [procaine Hcl]    Pt thinks he had a reaction to this med        Medication List     PAUSE taking these medications    metFORMIN 1000 MG tablet Wait to take this until: April 02, 2024 Commonly known as: GLUCOPHAGE Take 1,000 mg by mouth 2 (two) times daily with a meal.       TAKE these medications    aspirin  EC 81 MG tablet Take 81 mg by mouth daily.   clopidogrel  75 MG tablet Commonly known as: PLAVIX  Take 1 tablet (75 mg total) by mouth daily with breakfast.   empagliflozin  25 MG Tabs tablet Commonly known as: JARDIANCE  Take 25 mg by mouth daily.   isosorbide  mononitrate 30 MG 24 hr tablet Commonly known as: IMDUR  Take 2 tablets (60 mg total) by mouth daily. What changed: how much to take   lisinopril 5 MG tablet Commonly known as: ZESTRIL Take 2.5 mg by mouth daily.   metoprolol tartrate 25 MG tablet Commonly known as: LOPRESSOR Take 1 tablet (25 mg total) by mouth 2 (two) times daily. NEED APPOINTMENT FOR FUTURE REFILLS.   nitroGLYCERIN  0.4 MG SL tablet Commonly known as: NITROSTAT  Place 1 tablet (0.4 mg total) under the tongue every 5 (five) minutes as needed for chest pain.   pantoprazole  20 MG tablet Commonly known as: PROTONIX  Take 1 tablet (20 mg total) by mouth daily  before breakfast.   rosuvastatin  20 MG  tablet Commonly known as: CRESTOR  Take 1 tablet (20 mg total) by mouth daily.   venlafaxine XR 75 MG 24 hr capsule Commonly known as: EFFEXOR-XR Take 75 mg by mouth every other day.          Outstanding Labs/Studies  BMET at follow-up  Duration of Discharge Encounter: APP Time: 35 minutes   Signed, Thom LITTIE Sluder, PA-C 03/30/2024, 6:06 PM     "

## 2024-03-30 NOTE — Interval H&P Note (Signed)
 History and Physical Interval Note:  03/30/2024 4:08 PM  Dennis Wilcox  has presented today for surgery, with the diagnosis of chest pain.  The various methods of treatment have been discussed with the patient and family. After consideration of risks, benefits and other options for treatment, the patient has consented to  Procedures: LEFT HEART CATH AND CORONARY ANGIOGRAPHY (N/A) as a surgical intervention.  The patient's history has been reviewed, patient examined, no change in status, stable for surgery.  I have reviewed the patient's chart and labs.  Questions were answered to the patient's satisfaction.     [name] J Mollee Neer

## 2024-03-30 NOTE — ED Triage Notes (Signed)
 POV/ ambulatory/ stent placed on 03/22/2024/ pt c/o chest tightness/ 5/10 pain/ A&OX4

## 2024-03-30 NOTE — ED Notes (Signed)
 Phlebotomy able tech asked to grab labs.

## 2024-03-30 NOTE — H&P (Addendum)
 "  Cardiology Admission History and Physical  Patient ID: Dennis Wilcox MRN: [id number]; DOB: 09/18/1965   Admission date: 03/30/2024  PCP:  Verena Mems, MD   [facility] Providers Cardiologist:  Newman JINNY Lawrence, MD     Chief Complaint:  chest pain  Patient Profile: Dennis Wilcox is a 58 y.o. male with hypertension, hyperlipidemia, type 2 diabetes, nicotine dependence, coronary artery disease s/p PCI to RCA for STEMI in 2012 and recent DES to RCA 03/2024  who is being seen 03/30/2024 for the evaluation of chest pain.  History of Present Illness: Mr. Obremski has past medical history as listed above.  He was just recently discharged the same day PCI on 03/22/2024 after undergoing cardiac catheterization with stent placement to distal RCA.  He was discharged on DAPT with aspirin  and Plavix , Jardiance  25 mg daily, Imdur  30 mg daily, lisinopril 5 mg daily, Lopressor 25 mg twice daily, Crestor  20 mg daily.   He presented to the [facility] emergency department on 03/30/2024 with intermittent chest tightness, that has been ongoing since his stent placement December 16.   Relevant workup in the ED includes, troponin negative x 1, pending updated results, CBC normal, CMP shows mild AKI with creatinine at 1.26, previously noted to be 0.93, EKG showed sinus rhythm, HR 65, no significant changes when compared to EKG from 03/22/2024.  After speaking with the patient, and his wife they note that he has had intermittent bandlike chest pain across his chest since having his stent placed 03/22/2024.  He reports significant fatigue along with this.  He reports compliance with his DAPT, only missing this morning's dose.  Discussed with Dr. Lawrence and Dr. Court who came to the decision along with discussing with the patient, to repeat cardiac catheterization to ensure there was no changes since last cath.  Patient is agreeable to this plan.    Past Medical History:  Diagnosis Date    Atherosclerotic heart disease of native coronary artery without angina pectoris    CAD (coronary artery disease)    CKD (chronic kidney disease), stage II    Depression    DM (diabetes mellitus) (HCC)    Hyperlipemia 08/06/2010   [facility] completed ;repeated Feb 2013   Myocardial infarction (HCC) 06/30/2010   infer wall MI, cath 3 vessel CAD   Psoriasis    Past Surgical History:  Procedure Laterality Date   CARDIAC CATHETERIZATION  06/30/2010   Emergent   CORONARY ANGIOPLASTY WITH STENT PLACEMENT  06/30/2010   DES of RCA;LAD 60% prox,60%mid, 80% distal; 50% AVgroove stenosis, 50%distal circ stenosis   CORONARY BALLOON ANGIOPLASTY N/A 03/22/2024   Procedure: CORONARY BALLOON ANGIOPLASTY;  Surgeon: Lawrence Newman JINNY, MD;  Location: [facility] INVASIVE CV LAB;  Service: Cardiovascular;  Laterality: N/A;   CORONARY STENT INTERVENTION N/A 03/22/2024   Procedure: CORONARY STENT INTERVENTION;  Surgeon: Lawrence Newman JINNY, MD;  Location: [facility] INVASIVE CV LAB;  Service: Cardiovascular;  Laterality: N/A;   LEFT HEART CATH AND CORONARY ANGIOGRAPHY N/A 03/22/2024   Procedure: LEFT HEART CATH AND CORONARY ANGIOGRAPHY;  Surgeon: Lawrence Newman JINNY, MD;  Location: [facility] INVASIVE CV LAB;  Service: Cardiovascular;  Laterality: N/A;   R/S myocardial perfusion  08/21/2011   EF72%     Medications Prior to Admission: Prior to Admission medications  Medication Sig Start Date End Date Taking? Authorizing Provider  aspirin  EC 81 MG tablet Take 81 mg by mouth daily.    [provider]  clopidogrel  (PLAVIX ) 75 MG tablet Take  1 tablet (75 mg total) by mouth daily with breakfast. 03/23/24   Henry Shaver B, NP  empagliflozin  (JARDIANCE ) 25 MG TABS tablet Take 25 mg by mouth daily. 01/25/20   [provider]  isosorbide  mononitrate (IMDUR ) 30 MG 24 hr tablet Take 30 mg by mouth daily.     [provider]  lisinopril (PRINIVIL,ZESTRIL) 5 MG tablet Take 2.5 mg by mouth daily.      [provider]  metFORMIN (GLUCOPHAGE) 1000 MG tablet Take 1,000 mg by mouth 2 (two) times daily with a meal.     [provider]  metoprolol tartrate (LOPRESSOR) 25 MG tablet Take 1 tablet (25 mg total) by mouth 2 (two) times daily. NEED APPOINTMENT FOR FUTURE REFILLS. 04/14/14   Burnard Debby LABOR, MD  nitroGLYCERIN  (NITROSTAT ) 0.4 MG SL tablet Place 0.4 mg under the tongue every 5 (five) minutes as needed for chest pain.    [provider]  pantoprazole  (PROTONIX ) 20 MG tablet Take 1 tablet (20 mg total) by mouth daily before breakfast. 03/22/24   Henry Shaver B, NP  rosuvastatin  (CRESTOR ) 20 MG tablet Take 1 tablet (20 mg total) by mouth daily. Patient not taking: Reported on 03/17/2024 10/27/23   Elmira Newman PARAS, MD  venlafaxine XR (EFFEXOR-XR) 75 MG 24 hr capsule Take 75 mg by mouth every other day.     [provider]    Allergies:   Allergies[1]  Social History:   Social History   Socioeconomic History   Marital status: Single    Spouse name: Not on file   Number of children: Not on file   Years of education: Not on file   Highest education level: Not on file  Occupational History   Not on file  Tobacco Use   Smoking status: Former    Current packs/day: 0.00    Types: Cigarettes    Quit date: 04/07/2010    Years since quitting: 13.9   Smokeless tobacco: Never  Substance and Sexual Activity   Alcohol use: No   Drug use: Yes    Types: Marijuana   Sexual activity: Not on file  Other Topics Concern   Not on file  Social History Narrative   Not on file   Social Drivers of Health   Tobacco Use: High Risk (03/16/2024)   Received from [facility]   Patient History    Smoking Tobacco Use: Every Day    Smokeless Tobacco Use: Never    Passive Exposure: Current  Financial Resource Strain: Not on file  Food Insecurity: Not on file  Transportation Needs: Not on file  Physical Activity: Not on file  Stress: Not on file  Social  Connections: Not on file  Intimate Partner Violence: Not on file  Depression (EYV7-0): Not on file  Alcohol Screen: Not on file  Housing: Not on file  Utilities: Not on file  Health Literacy: Not on file    Family History:   The patient's family history is not on file.    ROS:  Please see the history of present illness.  All other ROS reviewed and negative.     Physical Exam/Data: Vitals:   03/30/24 1215 03/30/24 1240 03/30/24 1243  BP: (!) 148/83    Pulse: 67    Resp:  18   Temp:  98.2 F (36.8 C)   SpO2: 98%    Weight:   77 kg   No intake or output data in the 24 hours ending 03/30/24 1528  03/30/2024   12:43 PM 03/22/2024    7:29 AM 03/07/2024    1:53 PM  Last 3 Weights  Weight (lbs) 169 lb 12.1 oz 171 lb 171 lb  Weight (kg) 77 kg 77.565 kg 77.565 kg     Body mass index is 24.36 kg/m.  General:  Well nourished, well developed, in no acute distress HEENT: normal Neck: no JVD Vascular: No carotid bruits; Distal pulses 2+ bilaterally   Cardiac:  normal S1, S2; RRR; no murmur  Lungs:  clear to auscultation bilaterally, no wheezing, rhonchi or rales  Abd: soft, nontender, no hepatomegaly  Ext: no edema Musculoskeletal:  No deformities, BUE and BLE strength normal and equal Skin: warm and dry  Neuro:  CNs 2-12 intact, no focal abnormalities noted Psych:  Normal affect   EKG:  The ECG that was done 03/30/24 was personally reviewed and demonstrates sinus rhythm, no significant changes since EKG 03/22/2024  Relevant CV Studies: Cardiac catheterization, 03/22/2024 LM: No significant disease LAD: Proximal/mid calcified 30% disease          Mid to distal LAD tandem 80% stenoses, followed by 60% disease Lcx: Proximal 50% disease, distal focal 80% stenosis RCA: Dominant vessel, proximal/mid 20% disease           Distal RCA focal 90% ISR, RPDA 70% disease   LVEDP 13 mmHg   Successful percutaneous coronary intervention dRCA        Drug coated balloon angioplasty  3.0 X 20 mm (per PREVAIL trial) dRCA        Overlapping stent 3.0 X 16 mm Synergy drug-eluting stent, deployed at 16 atm (stent placement necessitated by focal dissection)        0% residual stenosis at the lesion, TIMI-3 flow      We will consider symptom guided treatment of mid to distal LAD, potentially with drug-coated balloon as part of peripheral clinical trial, after 30 days from today.   Echocardiogram, 12/29/2023 Left ventricular ejection fraction, by estimation, is 60 to 65% . Left ventricular ejection fraction by 3D volume is 65 % . The left ventricle has normal function. The left ventricle has no regional wall motion abnormalities. There is mild left ventricular hypertrophy. Left ventricular diastolic parameters were normal. The average left ventricular global longitudinal strain is - 20. 7 % . The global longitudinal strain is normal.  Right ventricular systolic function is normal. The right ventricular size is normal. Tricuspid regurgitation signal is inadequate for assessing PA pressure.  The mitral valve is normal in structure. Trivial mitral valve regurgitation. No evidence of mitral stenosis.  The aortic valve is tricuspid. Aortic valve regurgitation is not visualized. Aortic valve sclerosis/ calcification is present, without any evidence of aortic stenosis.  The inferior vena cava is normal in size with greater than 50% respiratory variability, suggesting right atrial pressure of 3 mmHg.  Laboratory Data: High Sensitivity Troponin:  No results for input(s): TROPONINIHS in the last 720 hours.  Recent Labs  Lab 03/30/24 1225  TRNPT <15        Chemistry Recent Labs  Lab 03/30/24 1225  NA 135  K 4.3  CL 98  CO2 25  GLUCOSE 266*  BUN 8  CREATININE 1.26*  CALCIUM  9.4  GFRNONAA >60  ANIONGAP 12    Recent Labs  Lab 03/30/24 1225  PROT 7.1  ALBUMIN 4.3  AST 20  ALT 17  ALKPHOS 104  BILITOT 0.4   Lipids No results for input(s): CHOL, TRIG, HDL,  LABVLDL, LDLCALC, CHOLHDL  in the last 168 hours. Hematology Recent Labs  Lab 03/30/24 1225  WBC 7.9  RBC 4.94  HGB 16.2  HCT 46.8  MCV 94.7  MCH 32.8  MCHC 34.6  RDW 12.2  PLT 392   Thyroid No results for input(s): TSH, FREET4 in the last 168 hours. BNPNo results for input(s): BNP, PROBNP in the last 168 hours.  DDimer No results for input(s): DDIMER in the last 168 hours.  Radiology/Studies:  DG Chest 1 View Result Date: 03/30/2024 EXAM: 1 VIEW XRAY OF THE CHEST 03/30/2024 12:33:00 PM COMPARISON: None available. CLINICAL HISTORY: Chest pain FINDINGS: LUNGS AND PLEURA: No focal pulmonary opacity. No pleural effusion. No pneumothorax. HEART AND MEDIASTINUM: Atherosclerotic calcification of aortic arch. No acute abnormality of the cardiac and mediastinal silhouettes. BONES AND SOFT TISSUES: No acute osseous abnormality. Multilevel thoracic osteophytosis. IMPRESSION: 1. No acute cardiopulmonary abnormality. Electronically signed by: Rogelia Myers MD 03/30/2024 01:03 PM EST RP Workstation: [id number]   Assessment and Plan:  Chest pain CAD s/p STEMI 2012 and recent DES to RCA 03/2024 Hyperlipidemia Patient presents with chest pain post recent stent to RCA Reports fatigue, intermittent band-like chest pain, 5/10 Recent LHC showed: 30% prox/mid LAD, 80% then 60% in dLAD, 90% ISR to dRCA treated with DES Reports intermittent chest pain, fatigue since cath with stent placement Reports no issues with taking DAPT, missed dose this morning 12/24 Discussed with Dr. Court and Dr. Elmira, going to proceed with a repeat cardiac catheterization Pending cath findings, may proceed with drug-coated balloon stent 30 days from prior cath Will continue DAPT, aspirin  plus Plavix  Continue home Imdur  30 mg daily post cath Continue Lopressor 25 mg twice daily  Informed Consent   Shared Decision Making/Informed Consent The risks [stroke (1 in 1000), death (1 in 1000), kidney  failure [usually temporary] (1 in 500), bleeding (1 in 200), allergic reaction [possibly serious] (1 in 200)], benefits (diagnostic support and management of coronary artery disease) and alternatives of a cardiac catheterization were discussed in detail with Mr. Hollar and he is willing to proceed.     Hypertension  Home meds: Imdur  30 mg daily, Lopressor 25 mg twice daily  Type 2 diabetes  On metformin at home He will be on SSI while in the hospital  Nicotine dependence  Inquire about nicotine replacement patch patient remains overnight  Risk Assessment/Risk Scores:        Code Status: Full Code  Severity of Illness: The appropriate patient status for this patient is INPATIENT. Inpatient status is judged to be reasonable and necessary in order to provide the required intensity of service to ensure the patient's safety. The patient's presenting symptoms, physical exam findings, and initial radiographic and laboratory data in the context of their chronic comorbidities is felt to place them at high risk for further clinical deterioration. Furthermore, it is not anticipated that the patient will be medically stable for discharge from the hospital within 2 midnights of admission.   * I certify that at the point of admission it is my clinical judgment that the patient will require inpatient hospital care spanning beyond 2 midnights from the point of admission due to high intensity of service, high risk for further deterioration and high frequency of surveillance required.*  For questions or updates, please contact [facility] Please consult [email or web address] for contact info under    Signed, Waddell DELENA Donath, PA-C  03/30/2024 3:28 PM   Agree with note by Waddell Donath, PA-C  Mr. Georg presents to the ER  with chest pain which she has had since recent RCA intervention performed 8 days ago by Dr.Patwardhan.  He has had prior RCA stenting 15 years ago.  He apparently had a  Myoview  stress test that showed mild inferoapical ischemia.  He had 95% in-stent restenosis within the proximal part of the previously placed RCA stent and ultimately underwent restenting using a 3 mm x 16 mm long Synergy drug-eluting stent.  He did have tandem 95% lesions in his mid to distal LAD which was a small caliber vessel.  After going home he has noticed profound fatigue as well as intermittent chest tightness on a daily basis worse today rated 5 out of 10.  His exam is benign.  His EKG shows no acute changes.  His first troponin is low.  Based on the fact that his symptoms occurred post intervention I favor relook angiography to assess his anatomy and determine etiology of his chest pain.  I have discussed this with the patient and with Dr. Elmira and they both agreed to proceed.  Dorn DOROTHA Lesches, M.D., FACP, [facility], FAHA, [facility]  [address], [address] [place], [place]  [address]  [phone] 03/30/2024 3:44 PM     [1]  Allergies Allergen Reactions   Novocain [Procaine Hcl]     Pt thinks he had a reaction to this med   "

## 2024-03-30 NOTE — ED Notes (Signed)
 Transportted to Cath lab with cath lab team

## 2024-03-30 NOTE — Discharge Instructions (Signed)

## 2024-03-31 ENCOUNTER — Encounter (HOSPITAL_COMMUNITY): Payer: Self-pay | Admitting: Cardiology

## 2024-04-01 ENCOUNTER — Other Ambulatory Visit (HOSPITAL_COMMUNITY): Payer: Self-pay

## 2024-04-12 ENCOUNTER — Encounter: Payer: Self-pay | Admitting: Emergency Medicine

## 2024-04-12 ENCOUNTER — Ambulatory Visit: Attending: Emergency Medicine | Admitting: Emergency Medicine

## 2024-04-12 VITALS — BP 112/72 | HR 60 | Ht 72.0 in | Wt 168.0 lb

## 2024-04-12 DIAGNOSIS — F17210 Nicotine dependence, cigarettes, uncomplicated: Secondary | ICD-10-CM | POA: Diagnosis not present

## 2024-04-12 DIAGNOSIS — I25118 Atherosclerotic heart disease of native coronary artery with other forms of angina pectoris: Secondary | ICD-10-CM | POA: Diagnosis not present

## 2024-04-12 DIAGNOSIS — I10 Essential (primary) hypertension: Secondary | ICD-10-CM

## 2024-04-12 DIAGNOSIS — E119 Type 2 diabetes mellitus without complications: Secondary | ICD-10-CM

## 2024-04-12 DIAGNOSIS — E782 Mixed hyperlipidemia: Secondary | ICD-10-CM

## 2024-04-12 LAB — LIPID PANEL

## 2024-04-12 MED ORDER — ISOSORBIDE MONONITRATE ER 30 MG PO TB24: 60.0000 mg | ORAL_TABLET | Freq: Every day | ORAL | 8 refills | Status: AC

## 2024-04-12 NOTE — Patient Instructions (Addendum)
 Medication Instructions:  NO CHANGES  Lab Work: CMET, FASTING LIPID PANEL, AND CBC TO NE DONE TODAY.  Testing/Procedures: NONE  Follow-Up: At [facility], you and your health needs are our priority.  As part of our continuing mission to provide you with exceptional heart care, our providers are all part of one team.  This team includes your primary Cardiologist (physician) and Advanced Practice Providers or APPs (Physician Assistants and Nurse Practitioners) who all work together to provide you with the care you need, when you need it.  Your next appointment:   3 MONTHS  Provider:   Newman JINNY Lawrence, MD    We recommend signing up for the patient portal called MyChart.  Sign up information is provided on this After Visit Summary.  MyChart is used to connect with patients for Virtual Visits (Telemedicine).  Patients are able to view lab/test results, encounter notes, upcoming appointments, etc.  Non-urgent messages can be sent to your provider as well.   To learn more about what you can do with MyChart, go to [email or web address].

## 2024-04-12 NOTE — Progress Notes (Signed)
 " Cardiology Office Note:    Date:  04/12/2024  ID:  Dennis Wilcox, DOB 05-11-1965, MRN [id number] PCP: Verena Mems, MD  [facility] Providers Cardiologist:  Newman JINNY Lawrence, MD Cardiology APP:  Rana Lum CROME, NP       Patient Profile:       Chief Complaint: Follow-up postcardiac catheterization History of Present Illness:  Dennis Wilcox is a 59 y.o. male with visit-pertinent history of hypertension, hyperlipidemia, type 2 diabetes, nicotine dependence, coronary artery disease  He has history of CAD s/p PCI to RCA for STEMI in 2012. He had undergone a stress test September 2025 that showed mild inferoapical ischemia.  It was suspected that he was having anginal equivalent symptoms with exertional dyspnea, fatigue with physical activity.  Cardiac catheterization was arranged for further evaluation.  He underwent cardiac catheterization 03/22/2024 showing 30% prox/mid LAD, 80% then 60% in dLAD, 90% ISR to dRCA treated with DES.  Plan for DAPT with ASA/Plavix  for at least 6 months.  He was discharged on Jardiance , Imdur , lisinopril, Lopressor, Crestor .  He had presented to the emergency department 03/30/2024 with intermittent chest tightness has been ongoing since stent placement on 12/16.  EKG showed sinus rhythm with no significant changes.  He underwent cardiac cath on 03/30/2024 showing moderate to severe multivessel CAD with patent distal RCA stent.  Disease in the LAD and LCx is either diffuse or into small caliber vessels and not amenable to PCI.  This was thought to be the cause of his anginal symptoms.  No PCI was performed.  Plan was for medical management.  His Imdur  was increased to 60 mg daily at discharge.   Discussed the use of AI scribe software for clinical note transcription with the patient, who gave verbal consent to proceed.  History of Present Illness Dennis Wilcox is a 59 year old male with coronary artery disease who presents for follow-up  after recent cardiac catheterizations. He is accompanied by his wife, [name].  Today he is doing well without acute cardiovascular concerns or complaints.  Since the second catheterization he has had no chest pain or prior belt-like chest tightness.  He is doing well and has resumed minimal activity without exertional chest discomfort.  He has remained adherent to his current medication regimen.  He has diabetes on metformin and Jardiance  currently.  He does report daily diarrhea with metformin.  He has smoked since his twenties and currently smokes five to six cigarettes per day, with prior quit attempts.  He is without chest pains, dyspnea, orthopnea, PND, lower extremity swelling, syncope, presyncope, lightheadedness, dizziness, melena, hematochezia   Review of systems:  Please see the history of present illness. All other systems are reviewed and otherwise negative.      Studies Reviewed:    EKG Interpretation Date/Time:  Tuesday April 12 2024 10:44:47 EST Ventricular Rate:  60 PR Interval:  164 QRS Duration:  94 QT Interval:  388 QTC Calculation: 388 R Axis:   -25  Text Interpretation: Normal sinus rhythm Incomplete right bundle branch block When compared with ECG of 30-Mar-2024 12:23, No significant change was found Confirmed by Rana Lum [phone]) on 04/12/2024 12:59:01 PM   Cardiac catheterization 03/30/2024 Conclusion: Moderate to severe multivessel CAD Patnet dRCA stent. Disease in LAD and Lcx is either diffsue or in too small caliber vessels and not amenable to PCI This could be cause of his angian symptoms. Diagnostic Dominance: Right   Cardiac catheterization 03/22/2024 Successful percutaneous coronary intervention dRCA  Drug coated balloon angioplasty 3.0 X 20 mm (per PREVAIL trial) dRCA        Overlapping stent 3.0 X 16 mm Synergy drug-eluting stent, deployed at 16 atm (stent placement necessitated by focal dissection)        0% residual stenosis  at the lesion, TIMI-3 flow Diagnostic Dominance: Right  Intervention   Exercise Myoview  12/29/2023   Findings are consistent with ischemia. The study is low risk.   No diagnostic ST deviation was noted. Nondiagnostic ST depression in the inferolateral leads noted in recovery.   LV perfusion is abnormal. There is evidence of ischemia. There is no evidence of infarction. Defect 1: There is a small defect with mild reduction in uptake present in the apical inferior location(s) that is reversible. There is normal wall motion in the defect area. Consistent with ischemia.   Nuclear stress EF: 63%. The left ventricular ejection fraction is normal (55-65%). End diastolic cavity size is normal. End systolic cavity size is normal.   CT images were obtained for attenuation correction and were examined for the presence of coronary calcium  when appropriate.   Coronary calcium  was present on the attenuation correction CT images. Severe coronary calcifications were present. Coronary calcifications were present in the left anterior descending artery, left circumflex artery and right coronary artery distribution(s).   Prior study available for comparison from 01/29/2018.  Echocardiogram 12/29/2023  1. Left ventricular ejection fraction, by estimation, is 60 to 65%. Left  ventricular ejection fraction by 3D volume is 65 %. The left ventricle has  normal function. The left ventricle has no regional wall motion  abnormalities. There is mild left  ventricular hypertrophy. Left ventricular diastolic parameters were  normal. The average left ventricular global longitudinal strain is -20.7  %. The global longitudinal strain is normal.   2. Right ventricular systolic function is normal. The right ventricular  size is normal. Tricuspid regurgitation signal is inadequate for assessing  PA pressure.   3. The mitral valve is normal in structure. Trivial mitral valve  regurgitation. No evidence of mitral stenosis.   4.  The aortic valve is tricuspid. Aortic valve regurgitation is not  visualized. Aortic valve sclerosis/calcification is present, without any  evidence of aortic stenosis.   5. The inferior vena cava is normal in size with greater than 50%  respiratory variability, suggesting right atrial pressure of 3 mmHg.    Risk Assessment/Calculations:              Physical Exam:   VS:  BP 112/72 (BP Location: Right Arm, Patient Position: Sitting, Cuff Size: Large)   Pulse 60   Ht 6' (1.829 m)   Wt 168 lb (76.2 kg)   BMI 22.78 kg/m    Wt Readings from Last 3 Encounters:  04/12/24 168 lb (76.2 kg)  03/30/24 169 lb 12.1 oz (77 kg)  03/22/24 171 lb (77.6 kg)    GEN: Well nourished, well developed in no acute distress NECK: No JVD; No carotid bruits CARDIAC: RRR, no murmurs, rubs, gallops RESPIRATORY:  Clear to auscultation without rales, wheezing or rhonchi  ABDOMEN: Soft, non-tender, non-distended EXTREMITIES:  No edema; No acute deformity      Assessment and Plan:  Coronary artery disease S/p PCI to RCA for STEMI in 2012 S/p overlapping DES to RCA on 03/22/2024 Most recent cath 03/30/2024 showed moderate to severe multivessel CAD with patent dRCA stent.  Disease in LAD and LCx is either diffuse or too small caliber vessels and not amendable to PCI.  Plan for medical management - EKG today without ischemic features - Today he is stable without chest pains or dyspnea.  He denies his prior anginal equivalent (chest tightness).  Has resumed minimal activity without exertional symptoms.  No indication for further ischemic evaluation at this time or changes in current antianginal therapy - Continue aspirin  81 mg daily and clopidogrel  75 mg daily for at least 6 months - Continue isosorbide  60 mg daily, lisinopril 2.5 mg daily, and metoprolol tartrate 25 mg twice daily - Continue rosuvastatin  20 mg daily - Continue nitroglycerin  as needed   Hyperlipidemia, LDL goal <55 LDL 98 on 07/2023 Crestor   was increased from 10 to 20 mg daily on 10/2023 - Plan for repeat fasting lipid panel today - Continue rosuvastatin  20 mg daily - Can consider increasing statin if needed to reach goal  Hypertension Blood pressure today is well-controlled at 112/72 - Continue metoprolol tartrate 25 mg twice daily and lisinopril 2.5 mg daily  T2DM A1c 7.9% on 07/2023 and not well-controlled - Managed on Jardiance  25 mg daily and metformin 1000 mg twice daily - Management per PCP  Nicotine dependence Currently smoking 5 to 6 cigarettes/day - Tobacco cessation encouraged - Discussed benefits of smoking cessation       Dispo:  Return in about 3 months (around 07/11/2024).  Signed, Lum LITTIE Louis, NP  "

## 2024-04-13 LAB — COMPREHENSIVE METABOLIC PANEL WITH GFR
ALT: 19 IU/L (ref 0–44)
AST: 16 IU/L (ref 0–40)
Albumin: 4.1 g/dL (ref 3.8–4.9)
Alkaline Phosphatase: 87 IU/L (ref 47–123)
BUN/Creatinine Ratio: 8 — AB (ref 9–20)
BUN: 8 mg/dL (ref 6–24)
Bilirubin Total: 0.3 mg/dL (ref 0.0–1.2)
CO2: 22 mmol/L (ref 20–29)
Calcium: 9.6 mg/dL (ref 8.7–10.2)
Chloride: 102 mmol/L (ref 96–106)
Creatinine, Ser: 1.06 mg/dL (ref 0.76–1.27)
Globulin, Total: 2.3 g/dL (ref 1.5–4.5)
Glucose: 153 mg/dL — AB (ref 70–99)
Potassium: 4.8 mmol/L (ref 3.5–5.2)
Sodium: 138 mmol/L (ref 134–144)
Total Protein: 6.4 g/dL (ref 6.0–8.5)
eGFR: 81 mL/min/1.73

## 2024-04-13 LAB — CBC
Hematocrit: 45.9 % (ref 37.5–51.0)
Hemoglobin: 15.3 g/dL (ref 13.0–17.7)
MCH: 32.6 pg (ref 26.6–33.0)
MCHC: 33.3 g/dL (ref 31.5–35.7)
MCV: 98 fL — ABNORMAL HIGH (ref 79–97)
Platelets: 342 x10E3/uL (ref 150–450)
RBC: 4.69 x10E6/uL (ref 4.14–5.80)
RDW: 12.9 % (ref 11.6–15.4)
WBC: 8.5 x10E3/uL (ref 3.4–10.8)

## 2024-04-13 LAB — LIPID PANEL
Cholesterol, Total: 170 mg/dL (ref 100–199)
HDL: 46 mg/dL
LDL CALC COMMENT:: 3.7 ratio (ref 0.0–5.0)
LDL Chol Calc (NIH): 84 mg/dL (ref 0–99)
Triglycerides: 238 mg/dL — AB (ref 0–149)
VLDL Cholesterol Cal: 40 mg/dL (ref 5–40)

## 2024-04-14 ENCOUNTER — Ambulatory Visit: Payer: Self-pay | Admitting: Emergency Medicine

## 2024-04-14 DIAGNOSIS — Z79899 Other long term (current) drug therapy: Secondary | ICD-10-CM

## 2024-04-18 MED ORDER — ROSUVASTATIN CALCIUM 40 MG PO TABS: 40.0000 mg | ORAL_TABLET | Freq: Every day | ORAL | 1 refills | Status: AC

## 2024-04-26 DIAGNOSIS — Z006 Encounter for examination for normal comparison and control in clinical research program: Secondary | ICD-10-CM

## 2024-05-03 NOTE — Research (Signed)
 Prevail 30 Day F/U  No adverse events to report or cardiovascular medication changes to report at this time.  Current Medications[1]     [1]  Current Outpatient Medications:    aspirin  EC 81 MG tablet, Take 81 mg by mouth daily., Disp: , Rfl:    clopidogrel  (PLAVIX ) 75 MG tablet, Take 1 tablet (75 mg total) by mouth daily with breakfast., Disp: 90 tablet, Rfl: 2   empagliflozin  (JARDIANCE ) 25 MG TABS tablet, Take 25 mg by mouth daily., Disp: , Rfl:    isosorbide  mononitrate (IMDUR ) 30 MG 24 hr tablet, Take 2 tablets (60 mg total) by mouth daily., Disp: 60 tablet, Rfl: 8   lisinopril (PRINIVIL,ZESTRIL) 5 MG tablet, Take 2.5 mg by mouth daily. , Disp: , Rfl:    metFORMIN (GLUCOPHAGE) 1000 MG tablet, Take 1,000 mg by mouth 2 (two) times daily with a meal. , Disp: , Rfl:    metoprolol tartrate (LOPRESSOR) 25 MG tablet, Take 1 tablet (25 mg total) by mouth 2 (two) times daily. NEED APPOINTMENT FOR FUTURE REFILLS., Disp: 30 tablet, Rfl: 0   nitroGLYCERIN  (NITROSTAT ) 0.4 MG SL tablet, Place 1 tablet (0.4 mg total) under the tongue every 5 (five) minutes as needed for chest pain., Disp: 25 tablet, Rfl: 2   pantoprazole  (PROTONIX ) 20 MG tablet, Take 1 tablet (20 mg total) by mouth daily before breakfast., Disp: 90 tablet, Rfl: 3   rosuvastatin  (CRESTOR ) 40 MG tablet, Take 1 tablet (40 mg total) by mouth daily., Disp: 90 tablet, Rfl: 1   venlafaxine XR (EFFEXOR-XR) 75 MG 24 hr capsule, Take 75 mg by mouth every other day. , Disp: , Rfl:
